# Patient Record
Sex: Male | Born: 1940 | Race: Black or African American | Hispanic: No | Marital: Single | Smoking: Former smoker
Health system: Southern US, Community
[De-identification: ages and names within clinical notes are randomized; demographics above are authoritative.]

---

## 2015-07-22 ENCOUNTER — Encounter (HOSPITAL_COMMUNITY): Payer: Self-pay | Admitting: Cardiology

## 2015-07-22 ENCOUNTER — Emergency Department (HOSPITAL_COMMUNITY): Payer: Medicare Other

## 2015-07-22 ENCOUNTER — Inpatient Hospital Stay (HOSPITAL_COMMUNITY)
Admission: EM | Admit: 2015-07-22 | Discharge: 2015-07-25 | DRG: 180 | Disposition: A | Discharge status: Home / Self Care | Payer: Medicare Other | Attending: Internal Medicine | Admitting: Internal Medicine

## 2015-07-22 DIAGNOSIS — E43 Unspecified severe protein-calorie malnutrition: Secondary | ICD-10-CM | POA: Diagnosis not present

## 2015-07-22 DIAGNOSIS — Z7729 Contact with and (suspected ) exposure to other hazardous substances: Secondary | ICD-10-CM | POA: Diagnosis present

## 2015-07-22 DIAGNOSIS — C787 Secondary malignant neoplasm of liver and intrahepatic bile duct: Secondary | ICD-10-CM | POA: Diagnosis not present

## 2015-07-22 DIAGNOSIS — C7889 Secondary malignant neoplasm of other digestive organs: Secondary | ICD-10-CM | POA: Diagnosis present

## 2015-07-22 DIAGNOSIS — C7972 Secondary malignant neoplasm of left adrenal gland: Secondary | ICD-10-CM | POA: Diagnosis present

## 2015-07-22 DIAGNOSIS — C3491 Malignant neoplasm of unspecified part of right bronchus or lung: Secondary | ICD-10-CM | POA: Diagnosis not present

## 2015-07-22 DIAGNOSIS — C781 Secondary malignant neoplasm of mediastinum: Secondary | ICD-10-CM | POA: Diagnosis present

## 2015-07-22 DIAGNOSIS — R0602 Shortness of breath: Secondary | ICD-10-CM | POA: Diagnosis not present

## 2015-07-22 DIAGNOSIS — Z87891 Personal history of nicotine dependence: Secondary | ICD-10-CM | POA: Diagnosis not present

## 2015-07-22 DIAGNOSIS — G4733 Obstructive sleep apnea (adult) (pediatric): Secondary | ICD-10-CM | POA: Diagnosis present

## 2015-07-22 DIAGNOSIS — R918 Other nonspecific abnormal finding of lung field: Secondary | ICD-10-CM | POA: Insufficient documentation

## 2015-07-22 DIAGNOSIS — Z23 Encounter for immunization: Secondary | ICD-10-CM | POA: Diagnosis not present

## 2015-07-22 DIAGNOSIS — Z578 Occupational exposure to other risk factors: Secondary | ICD-10-CM

## 2015-07-22 DIAGNOSIS — Z681 Body mass index (BMI) 19 or less, adult: Secondary | ICD-10-CM | POA: Diagnosis not present

## 2015-07-22 DIAGNOSIS — J439 Emphysema, unspecified: Secondary | ICD-10-CM | POA: Diagnosis not present

## 2015-07-22 DIAGNOSIS — J449 Chronic obstructive pulmonary disease, unspecified: Secondary | ICD-10-CM

## 2015-07-22 DIAGNOSIS — C7951 Secondary malignant neoplasm of bone: Secondary | ICD-10-CM | POA: Diagnosis not present

## 2015-07-22 DIAGNOSIS — R079 Chest pain, unspecified: Secondary | ICD-10-CM | POA: Diagnosis present

## 2015-07-22 DIAGNOSIS — Z7982 Long term (current) use of aspirin: Secondary | ICD-10-CM

## 2015-07-22 DIAGNOSIS — C3490 Malignant neoplasm of unspecified part of unspecified bronchus or lung: Secondary | ICD-10-CM | POA: Diagnosis present

## 2015-07-22 DIAGNOSIS — Z720 Tobacco use: Secondary | ICD-10-CM | POA: Diagnosis not present

## 2015-07-22 DIAGNOSIS — K769 Liver disease, unspecified: Secondary | ICD-10-CM | POA: Insufficient documentation

## 2015-07-22 DIAGNOSIS — K8689 Other specified diseases of pancreas: Secondary | ICD-10-CM | POA: Insufficient documentation

## 2015-07-22 DIAGNOSIS — F17200 Nicotine dependence, unspecified, uncomplicated: Secondary | ICD-10-CM

## 2015-07-22 DIAGNOSIS — C799 Secondary malignant neoplasm of unspecified site: Secondary | ICD-10-CM

## 2015-07-22 LAB — CBC
HEMATOCRIT: 41.2 % (ref 39.0–52.0)
HEMOGLOBIN: 13.2 g/dL (ref 13.0–17.0)
MCH: 27.4 pg (ref 26.0–34.0)
MCHC: 32 g/dL (ref 30.0–36.0)
MCV: 85.5 fL (ref 78.0–100.0)
Platelets: 358 10*3/uL (ref 150–400)
RBC: 4.82 MIL/uL (ref 4.22–5.81)
RDW: 13.8 % (ref 11.5–15.5)
WBC: 6.8 10*3/uL (ref 4.0–10.5)

## 2015-07-22 LAB — HEPATIC FUNCTION PANEL
ALT: 15 U/L — AB (ref 17–63)
AST: 35 U/L (ref 15–41)
Albumin: 3.5 g/dL (ref 3.5–5.0)
Alkaline Phosphatase: 71 U/L (ref 38–126)
BILIRUBIN INDIRECT: 0.4 mg/dL (ref 0.3–0.9)
Bilirubin, Direct: 0.1 mg/dL (ref 0.1–0.5)
TOTAL PROTEIN: 7.1 g/dL (ref 6.5–8.1)
Total Bilirubin: 0.5 mg/dL (ref 0.3–1.2)

## 2015-07-22 LAB — I-STAT TROPONIN, ED: Troponin i, poc: 0.01 ng/mL (ref 0.00–0.08)

## 2015-07-22 LAB — BASIC METABOLIC PANEL
Anion gap: 10 (ref 5–15)
BUN: 12 mg/dL (ref 6–20)
CHLORIDE: 97 mmol/L — AB (ref 101–111)
CO2: 29 mmol/L (ref 22–32)
Calcium: 10 mg/dL (ref 8.9–10.3)
Creatinine, Ser: 0.9 mg/dL (ref 0.61–1.24)
GFR calc Af Amer: >60 mL/min (ref >60)
GLUCOSE: 108 mg/dL — AB (ref 65–99)
POTASSIUM: 4.8 mmol/L (ref 3.5–5.1)
Sodium: 136 mmol/L (ref 135–145)

## 2015-07-22 LAB — APTT: APTT: 27 s (ref 24–37)

## 2015-07-22 LAB — PROTIME-INR
INR: 1.01 (ref 0.00–1.49)
Prothrombin Time: 13.5 seconds (ref 11.6–15.2)

## 2015-07-22 MED ORDER — HYDROCODONE-ACETAMINOPHEN 5-325 MG PO TABS
1.0000 | ORAL_TABLET | ORAL | Status: DC | PRN
  Administered 2015-07-23 (×3): 1 via ORAL
  Administered 2015-07-24 – 2015-07-25 (×5): 2 via ORAL
  Filled 2015-07-22: qty 2
  Filled 2015-07-22: qty 1
  Filled 2015-07-22: qty 2
  Filled 2015-07-22: qty 1
  Filled 2015-07-22 (×3): qty 2
  Filled 2015-07-22: qty 1

## 2015-07-22 MED ORDER — ASPIRIN EC 325 MG PO TBEC: 325.0000 mg | DELAYED_RELEASE_TABLET | Freq: Four times a day (QID) | ORAL | Status: DC | PRN

## 2015-07-22 MED ORDER — IOHEXOL 300 MG/ML  SOLN
75.0000 mL | Freq: Once | INTRAMUSCULAR | Status: AC | PRN
  Administered 2015-07-22: 75 mL via INTRAVENOUS

## 2015-07-22 MED ORDER — BISACODYL 10 MG RE SUPP: 10.0000 mg | Freq: Every day | RECTAL | Status: DC | PRN

## 2015-07-22 MED ORDER — IPRATROPIUM BROMIDE 0.02 % IN SOLN
0.5000 mg | Freq: Once | RESPIRATORY_TRACT | Status: AC
  Administered 2015-07-22: 0.5 mg via RESPIRATORY_TRACT
  Filled 2015-07-22: qty 2.5

## 2015-07-22 MED ORDER — ONDANSETRON HCL 4 MG PO TABS
4.0000 mg | ORAL_TABLET | Freq: Four times a day (QID) | ORAL | Status: DC | PRN
Start: 2015-07-22 — End: 2015-07-25

## 2015-07-22 MED ORDER — MAGNESIUM HYDROXIDE 400 MG/5ML PO SUSP: 30.0000 mL | Freq: Every day | ORAL | Status: DC | PRN

## 2015-07-22 MED ORDER — ACETAMINOPHEN 650 MG RE SUPP: 650.0000 mg | Freq: Four times a day (QID) | RECTAL | Status: DC | PRN

## 2015-07-22 MED ORDER — ACETAMINOPHEN 325 MG PO TABS: 650.0000 mg | ORAL_TABLET | Freq: Four times a day (QID) | ORAL | Status: DC | PRN

## 2015-07-22 MED ORDER — FLEET ENEMA 7-19 GM/118ML RE ENEM
1.0000 | ENEMA | Freq: Once | RECTAL | Status: DC | PRN
  Filled 2015-07-22: qty 1

## 2015-07-22 MED ORDER — ENOXAPARIN SODIUM 30 MG/0.3ML ~~LOC~~ SOLN
30.0000 mg | SUBCUTANEOUS | Status: DC
  Administered 2015-07-23 – 2015-07-24 (×2): 30 mg via SUBCUTANEOUS
  Filled 2015-07-22 (×2): qty 0.3

## 2015-07-22 MED ORDER — ALBUTEROL SULFATE (2.5 MG/3ML) 0.083% IN NEBU
5.0000 mg | INHALATION_SOLUTION | Freq: Once | RESPIRATORY_TRACT | Status: AC
  Administered 2015-07-22: 5 mg via RESPIRATORY_TRACT
  Filled 2015-07-22: qty 6

## 2015-07-22 MED ORDER — ONDANSETRON HCL 4 MG/2ML IJ SOLN: 4.0000 mg | Freq: Four times a day (QID) | INTRAMUSCULAR | Status: DC | PRN

## 2015-07-22 MED ORDER — GUAIFENESIN 100 MG/5ML PO SYRP
200.0000 mg | ORAL_SOLUTION | Freq: Three times a day (TID) | ORAL | Status: DC | PRN
  Filled 2015-07-22: qty 10

## 2015-07-22 MED ORDER — SODIUM CHLORIDE 0.9 % IV SOLN
INTRAVENOUS | Status: AC
  Administered 2015-07-22: via INTRAVENOUS

## 2015-07-22 NOTE — ED Notes (Signed)
Reports chest pain, SOb and cough for the past month intermittently.

## 2015-07-22 NOTE — ED Notes (Signed)
Family at bedside. 

## 2015-07-22 NOTE — ED Notes (Signed)
Pt remains monitored by blood pressure and pulse ox.  

## 2015-07-22 NOTE — Progress Notes (Signed)
Called ER RN for report. Room ready.  

## 2015-07-22 NOTE — ED Notes (Signed)
Patient undressed, in gown, on monitor, continuous pulse oximetry and blood pressure cuff; 2 warm blankets given; visitor at bedside

## 2015-07-22 NOTE — H&P (Deleted)
[facility] History and Physical  Caleb Decock LEX:[id number] DOB: 1941/04/08 DOA: 07/22/2015  Referring physician: [facility]  PCP: No primary care provider on file.   Chief Complaint: cough/ SOB  HPI: Tyler Potter is a 74 y.o. male with no PMH, has not seen a doctor in years, came to ED with chest pain, SOB and cough for the past month or so, also weight loss.  Labs normal, no home meds, does not see doctors.  Daughter said she has been trying to "get him to come in" for some time now. CXR in ED showing R upper lobe collapse, suspected underlying lung mass. And +COPD. Heart normal.  Chest CT shows> a right perihilar mass which extends through the mediastinum into the sub carinal and aortopulmonary window regions. Postobstructive volume loss right upper lobe. Findings consistent with malignancy. Also evidence of pulmonary parenchymal metastasis, hepatic and adrenal metastasis and possible bone metastasis to the spine.  Patient's main complaint is chest pain and SOB with cough of whitish sputum in abundance. Nofevers.   Chart review: none  ROS  no fevers, no abd pain  intermittent constipation +  no diarrhea  no purulent sputum  no wheezing  no medical hx  no joint pain  Where does patient live ho[place] with family  Can patient participate in ADLs? For the most part  Past Medical History History reviewed. No pertinent past medical history. Past Surgical History History reviewed. No pertinent past surgical history. Family History History reviewed. No pertinent family history.  Social History  reports that he has quit smoking. He does not have any smokeless tobacco history on file. He reports that he does not drink alcohol or use illicit drugs. Allergies No Known Allergies Home medications Prior to Admission medications   Medication Sig Start Date End Date Taking? Authorizing Provider  aspirin 325 MG EC tablet Take 325 mg by mouth every 6 (six) hours as needed for  pain.   Yes Historical Provider, MD  guaifenesin (ROBITUSSIN) 100 MG/5ML syrup Take 200 mg by mouth 3 (three) times daily as needed for cough.   Yes Historical Provider, MD   Liver Function Tests No results for input(s): AST, ALT, ALKPHOS, BILITOT, PROT, ALBUMIN in the last 168 hours. No results for input(s): LIPASE, AMYLASE in the last 168 hours. CBC  Recent Labs Lab 07/22/15 1323  WBC 6.8  HGB 13.2  HCT 41.2  MCV 85.5  PLT 449   Basic Metabolic Panel  Recent Labs Lab 07/22/15 1323  NA 136  K 4.8  CL 97*  CO2 29  GLUCOSE 108*  BUN 12  CREATININE 0.90  CALCIUM 10.0     Filed Vitals:   07/22/15 1900 07/22/15 2054 07/22/15 2100 07/22/15 2130  BP: 137/79 138/88 125/78 135/83  Pulse: 94 75 78 72  Temp:      TempSrc:      Resp:  '18 17 16  '$ SpO2: 100% 97% 100% 97%   Exam: Small-framed AAM in no distress. Wt loss evident.  No rash, cyanosis or gangrene Sclera anicteric, throat clear No jvd or nodes in neck Chest decreased BS throughout, no wheezing, occ rhonchi RRR no MRG Abd is soft ntnd no mass or ascites GU normal male Ext no LE edema, no wounds/ amputations/ ulcers Neuro is alert, O x3 , pleasant  CXR (independently reviewed) > R upper lobe collapse CT chest > see above in HPI Home medications > none Labs reviewed - wnl  Assessment: 1. 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 Chest pain/ SOB/ cough - no PNA or fever, prob due to malignancy 3. COPD  4. Smoker  Plan - admit, get ONC consult in am   DVT Prophylaxis lovenox  Code Status: full  Family Communication: daughter at bedside  Disposition Plan: home when stable    Sol Blazing [facility] Pager [phone]  Cell [phone]  If 7PM-7AM, please contact night-coverage [email or web address] Password [email or web address] 07/22/2015, 9:59 PM

## 2015-07-22 NOTE — ED Provider Notes (Signed)
CSN: [id number]     Arrival date & time 07/22/15  1256 History   First MD Initiated Contact with Patient 07/22/15 1644     Chief Complaint  Patient presents with  . Chest Pain  . Cough     (Consider location/radiation/quality/duration/timing/severity/associated sxs/prior Treatment) Patient is a 74 y.o. male presenting with chest pain and cough. The history is provided by the patient and medical records.  Chest Pain Associated symptoms: cough   Cough Associated symptoms: chest pain     74 year old male with no documented medical history, presenting to the ED for intermittent chest pain and productive cough over the past month.  Patient states he feels that there is a lot of congestion in his chest. He states he will have a productive cough for a few days and began to feel better but symptoms generally recur. He states he is coughing so much he is having "soreness" in his chest. He states he does have some episodes of shortness of breath with heavy bouts of coughing. He denies any fever or chills. No sweats. Patient has no known cardiac history. Mother did have heart disease. Patient is a former smoker, quit approximately 1 year ago. He did smoke for approximately 60 years.  Patient's daughter brought him here from [facility] for evaluation. He has not seen a doctor in several years. She admits that patient appears thinner than normal, appears to have lost approximately 20 pounds or so.  Patient states he eats/drinks regularly, just not as much as normal due to decreased appetite.  VSS.  History reviewed. No pertinent past medical history. History reviewed. No pertinent past surgical history. History reviewed. No pertinent family history. Social History  Substance Use Topics  . Smoking status: Former Research scientist (life sciences)  . Smokeless tobacco: None  . Alcohol Use: No    Review of Systems  Constitutional: Positive for appetite change and unexpected weight change.  Respiratory: Positive for cough.    Cardiovascular: Positive for chest pain.  All other systems reviewed and are negative.     Allergies  Review of patient's allergies indicates no known allergies.  Home Medications   Prior to Admission medications   Medication Sig Start Date End Date Taking? Authorizing Provider  aspirin 325 MG EC tablet Take 325 mg by mouth every 6 (six) hours as needed for pain.   Yes Historical Provider, MD  guaifenesin (ROBITUSSIN) 100 MG/5ML syrup Take 200 mg by mouth 3 (three) times daily as needed for cough.   Yes Historical Provider, MD   BP 145/79 mmHg  Pulse 71  Temp(Src) 98.2 F (36.8 C) (Oral)  Resp 18  SpO2 99%   Physical Exam  Constitutional: He is oriented to person, place, and time. He appears well-developed and well-nourished.  Very thin  HENT:  Head: Normocephalic and atraumatic.  Mouth/Throat: Oropharynx is clear and moist.  Eyes: Conjunctivae and EOM are normal. Pupils are equal, round, and reactive to light.  Neck: Normal range of motion.  Cardiovascular: Normal rate, regular rhythm and normal heart sounds.   Pulmonary/Chest: Effort normal. No respiratory distress. He has no wheezes. He has rhonchi.  Coarse breath sounds bilaterally with intermixed rhonchi, no distress, speaking in full sentences without difficulty  Abdominal: Soft. Bowel sounds are normal.  Musculoskeletal: Normal range of motion.  Neurological: He is alert and oriented to person, place, and time.  Skin: Skin is warm and dry.  Psychiatric: He has a normal mood and affect.  Nursing note and vitals reviewed.   ED Course  Procedures (including critical care time) Labs Review Labs Reviewed  BASIC METABOLIC PANEL - Abnormal; Notable for the following:    Chloride 97 (*)    Glucose, Bld 108 (*)    All other components within normal limits  HEPATIC FUNCTION PANEL - Abnormal; Notable for the following:    ALT 15 (*)    All other components within normal limits  CBC  PROTIME-INR  APTT  I-STAT  TROPOININ, ED    Imaging Review Dg Chest 2 View  07/22/2015  CLINICAL DATA:  Productive cough for the past 2 weeks. Mid chest pain. Ex-smoker. EXAM: CHEST  2 VIEW COMPARISON:  None. FINDINGS: Right upper lobe collapse. Bilateral nipple shadows. Mild hyperexpansion of the lungs with mild central peribronchial thickening. Normal sized heart. Thoracolumbar spine degenerative changes. IMPRESSION: 1. Right upper lobe collapse. This is concerning for a centrally obstructing mass or mucus plug. A chest CT with contrast may provide useful additional information. 2. Mild changes of COPD and chronic bronchitis. Electronically Signed   By: Claudie Revering M.D.   On: 07/22/2015 13:54   Ct Chest W Contrast  07/22/2015  CLINICAL DATA:  Productive cough for 1 month, shortness of breath, abnormal chest radiograph, smoking history EXAM: CT CHEST WITH CONTRAST TECHNIQUE: Multidetector CT imaging of the chest was performed during intravenous contrast administration. CONTRAST:  75m OMNIPAQUE IOHEXOL 300 MG/ML  SOLN COMPARISON:  Chest radiograph 07/22/2015 showing right upper lobe opacity FINDINGS: There is mild centrilobular emphysematous change. There is no evidence of endobronchial mass or mucous plug. The right upper lobe bronchus appear extrinsically compressed by a large soft tissue mass. There is a sub carinal heterogeneous enhancing mass which extends into the right hilum and perihilar right upper lobe. There is associated collapse of the right upper lobe in the right suprahilar region, mass measures about 37 x 50 mm. In the sub carinal region, the mass measures about 23 x 28 mm. Mass and into the anterior mediastinum and into the aortopulmonary window, worse soft tissue mass component measures 11 x 22 mm. There is a microlobulated mass image number 42 in the right lower lobe, measuring 15 x 9 mm. There is a subpleural nodule image number 51 right lower lobe measuring 4 mm. There is an area of irregular ground-glass  attenuation laterally in the right lower lobe measuring about 1 cm, and in the inferior right middle lobe on image number 37 there is a 5 mm nodule. There is mild volume loss in the inferior lingula. There is a sub solid pulmonary nodule laterally in the left lung base on image number 56 measuring 7 mm. Thyroid is normal. Thoracic aorta is normal. No abnormalities involving pulmonary arteries. Minimal pericardial effusion. No pleural effusion. Images through the upper abdomen demonstrate innumerable liver masses with peripheral enhancement, the largest in the left lobe measuring 3 cm. There is a left adrenal gland mass measuring about 3 cm. There is mild fullness of the right adrenal gland without discrete mass. There is sclerosis of numerous lower cervical and upper thoracic vertebra possibly representing metastasis. IMPRESSION: 1. Right perihilar mass which extends through the mediastinum into the sub carinal and aortopulmonary window regions. Postobstructive volume loss right upper lobe. Findings consistent with malignancy. 2. Evidence of pulmonary parenchymal metastasis 3. Hepatic and adrenal metastasis 4. Possible bone metastasis to the spine. Electronically Signed   By: RSkipper ClicheM.D.   On: 07/22/2015 19:27   I have personally reviewed and evaluated these images and lab results as part  of my medical decision-making.   EKG Interpretation None      MDM   Final diagnoses:  Metastatic lung cancer (metastasis from lung to other site), unspecified laterality [facility])   74 year old male here with intermittent chest pain, cough, and weight loss over the past month. Strong smoking history.  Patient appears very thin.  No fever, chills, sweats.  Lungs with coarse breath sounds bilaterally.  Concern for possible malignancy.  Labs overall reassuring.  CXR with questionable mass vs mucus plug.  CT chest was obtained revealing right perihilar mass extending into mediastinum with likely mets of liver,  adrenals, and possibly bone as well.  Patient is without any ongoing medical care, appears malnourished, and will need further work-up with oncology consultation.  Patient will be admitted to medicine service for further management.  Results discussed at length with patient and daughter, they acknowledged understanding.  Larene Pickett, PA-C 07/22/15 2245  Leonard Schwartz, MD 07/25/15 [phone]

## 2015-07-23 ENCOUNTER — Observation Stay (HOSPITAL_COMMUNITY): Payer: Medicare Other

## 2015-07-23 DIAGNOSIS — J439 Emphysema, unspecified: Secondary | ICD-10-CM | POA: Diagnosis not present

## 2015-07-23 DIAGNOSIS — E46 Unspecified protein-calorie malnutrition: Secondary | ICD-10-CM

## 2015-07-23 DIAGNOSIS — K769 Liver disease, unspecified: Secondary | ICD-10-CM | POA: Insufficient documentation

## 2015-07-23 DIAGNOSIS — R918 Other nonspecific abnormal finding of lung field: Secondary | ICD-10-CM | POA: Diagnosis not present

## 2015-07-23 DIAGNOSIS — C3491 Malignant neoplasm of unspecified part of right bronchus or lung: Secondary | ICD-10-CM | POA: Diagnosis not present

## 2015-07-23 DIAGNOSIS — R0602 Shortness of breath: Secondary | ICD-10-CM

## 2015-07-23 DIAGNOSIS — K8689 Other specified diseases of pancreas: Secondary | ICD-10-CM | POA: Insufficient documentation

## 2015-07-23 DIAGNOSIS — R938 Abnormal findings on diagnostic imaging of other specified body structures: Secondary | ICD-10-CM | POA: Diagnosis not present

## 2015-07-23 DIAGNOSIS — Z720 Tobacco use: Secondary | ICD-10-CM

## 2015-07-23 DIAGNOSIS — R079 Chest pain, unspecified: Secondary | ICD-10-CM | POA: Diagnosis not present

## 2015-07-23 MED ORDER — INFLUENZA VAC SPLIT QUAD 0.5 ML IM SUSY
0.5000 mL | PREFILLED_SYRINGE | INTRAMUSCULAR | Status: AC
  Administered 2015-07-24: 0.5 mL via INTRAMUSCULAR
  Filled 2015-07-23: qty 0.5

## 2015-07-23 MED ORDER — IOHEXOL 300 MG/ML  SOLN
80.0000 mL | Freq: Once | INTRAMUSCULAR | Status: AC | PRN
  Administered 2015-07-23: 100 mL via INTRAVENOUS

## 2015-07-23 MED ORDER — PNEUMOCOCCAL VAC POLYVALENT 25 MCG/0.5ML IJ INJ
0.5000 mL | INJECTION | INTRAMUSCULAR | Status: AC
  Administered 2015-07-24: 0.5 mL via INTRAMUSCULAR
  Filled 2015-07-23: qty 0.5

## 2015-07-23 NOTE — H&P (Deleted)
Expand All Collapse All   [facility] History and Physical  Hymie Gorr QBH:[id number] DOB: April 11, 1941 DOA: 07/22/2015  Referring physician: [facility]  PCP: No primary care provider on file.   Chief Complaint: cough/ SOB  HPI: Tyler Potter is a 74 y.o. male with no PMH, has not seen a doctor in years, came to ED with chest pain, SOB and cough for the past month or so, also weight loss. Labs normal, no home meds, does not see doctors. Daughter said she has been trying to "get him to come in" for some time now. CXR in ED showing R upper lobe collapse, suspected underlying lung mass. And +COPD. Heart normal.  Chest CT shows> a right perihilar mass which extends through the mediastinum into the sub carinal and aortopulmonary window regions. Postobstructive volume loss right upper lobe. Findings consistent with malignancy. Also evidence of pulmonary parenchymal metastasis, hepatic and adrenal metastasis and possible bone metastasis to the spine.  Patient's main complaint is chest pain and SOB with cough of whitish sputum in abundance. Nofevers.   Chart review: none  ROS no fevers, no abd pain intermittent constipation + no diarrhea no purulent sputum no wheezing no medical hx no joint pain  Where does patient live [place] with family  Can patient participate in ADLs? For the most part  Past Medical History History reviewed. No pertinent past medical history. Past Surgical History History reviewed. No pertinent past surgical history. Family History History reviewed. No pertinent family history.  Social History  reports that he has quit smoking. He does not have any smokeless tobacco history on file. He reports that he does not drink alcohol or use illicit drugs. Allergies No Known Allergies Home medications Prior to Admission medications   Medication Sig Start Date End Date Taking? Authorizing Provider  aspirin 325 MG EC tablet  Take 325 mg by mouth every 6 (six) hours as needed for pain.   Yes Historical Provider, MD  guaifenesin (ROBITUSSIN) 100 MG/5ML syrup Take 200 mg by mouth 3 (three) times daily as needed for cough.   Yes Historical Provider, MD   Liver Function Tests  Last Labs     No results for input(s): AST, ALT, ALKPHOS, BILITOT, PROT, ALBUMIN in the last 168 hours.    Last Labs     No results for input(s): LIPASE, AMYLASE in the last 168 hours.   CBC  Last Labs      Recent Labs Lab 07/22/15 1323  WBC 6.8  HGB 13.2  HCT 41.2  MCV 85.5  PLT 358     Basic Metabolic Panel  Last Labs      Recent Labs Lab 07/22/15 1323  NA 136  K 4.8  CL 97*  CO2 29  GLUCOSE 108*  BUN 12  CREATININE 0.90  CALCIUM 10.0       Filed Vitals:   07/22/15 1900 07/22/15 2054 07/22/15 2100 07/22/15 2130  BP: 137/79 138/88 125/78 135/83  Pulse: 94 75 78 72  Temp:      TempSrc:      Resp:  '18 17 16  '$ SpO2: 100% 97% 100% 97%   Exam: Small-framed AAM in no distress. Wt loss evident.  No rash, cyanosis or gangrene Sclera anicteric, throat clear No jvd or nodes in neck Chest decreased BS throughout, no wheezing, occ rhonchi RRR no MRG Abd is soft ntnd no mass or ascites GU normal male Ext no LE edema,  no wounds/ amputations/ ulcers Neuro is alert, O x3 , pleasant  CXR (independently reviewed) > R upper lobe collapse CT chest > see above in HPI Home medications > none Labs reviewed - wnl  Assessment: 1. 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 Chest pain/ SOB/ cough - no PNA or fever, prob due to malignancy 3. COPD  4. Smoker  Plan - admit, get ONC consult in am   DVT Prophylaxis lovenox  Code Status: full  Family Communication: daughter at bedside  Disposition Plan: home when stable    Sol Blazing [facility] Pager [phone] Cell [phone]  If 7PM-7AM, please contact  night-coverage [email or web address] Password [email or web address] 07/22/2015, 9:59 PM         Deleted by: Roney Jaffe, MD at 07/23/2015 1:05 AM  Chart Correction History     Date/Time User Action     07/23/2015 1:05 AM Roney Jaffe, MD Delete Data     Routing History     Date/Time From To Method   07/22/2015 10:44 PM Roney Jaffe, MD Roney Jaffe, MD Fax

## 2015-07-23 NOTE — Consult Note (Signed)
Tyler Potter  Telephone:([phone]) [place] NOTE  Tyler Potter                                MR#: [id number]  DOB: [birth year]                       CSN#: [id number]  Referring MD: Dr. Shel[facility]  Primary Provider: Mariah Milling, MD  Reason for Consult: Lung Cancer   LPF:[id number] Tyler Potter is a 74 y.o. male not seen by a physician in many years, admitted on 10/24 with increasing, 1 month history shortness of breath and chest pain. Denies fevers, chills, night sweats or mucositis. He reports frothy sputum production without hemoptysis. Denies any   palpitations. Denies lower extremity swelling. Denies nausea, heartburn or change in bowel habits. Appetite is normal, but has had unintentional 15 lbs weight loss over the last 6 months. Denies any dysuria. Denies abnormal skin rashes, or neuropathy. Denies any bleeding issues such as epistaxis, hematemesis, hematuria or hematochezia. Denies headaches, seizures, vision changes, bladder or bowel incontinence. No confusion is reported. Ambulating without difficulty. At the ED a chest x ray revealed right upper lobe collapse prompting further workup. Chest CT on 10/24 showed a right perihilar mass extending through the mediastinum into the subcarinal and aortopulmonary window regions. Postobstructive volume loss right upper lobe was seen. Also, evidence of pulmonary parenchymal metastasis, hepatic and adrenal metastasis and possible bone metastasis to the spine were noted. CT of the abdomen and pelvis are pending. IR to perform biopsy after evaluating all films for best sample. Risk factors include prior tobacco abuse, COPD, OSA, exposure to hazardous chemicals at work prior to retirement. No family history of cancers We were requested to see this patient with recommendations.        PMH:  History reviewed. No pertinent past medical history.  Surgeries:  History reviewed. No pertinent past  surgical history.  Allergies: No Known Allergies  Medications:   Scheduled Meds: . enoxaparin (LOVENOX) injection  30 mg Subcutaneous Q24H  . [START ON 07/24/2015] Influenza vac split quadrivalent PF  0.5 mL Intramuscular Tomorrow-1000  . [START ON 07/24/2015] pneumococcal 23 valent vaccine  0.5 mL Intramuscular Tomorrow-1000   Continuous Infusions:   PRN Meds:.bisacodyl, guaifenesin, HYDROcodone-acetaminophen, magnesium hydroxide, sodium phosphate  DZH:[id number] **OR** acetaminophen, bisacodyl, guaifenesin, HYDROcodone-acetaminophen, magnesium hydroxide, ondansetron **OR** ondansetron (ZOFRAN) IV, sodium phosphate  ROS: Constitutional: Denies fevers, chills or abnormal night sweats Eyes: Denies blurriness of vision, double vision or watery eyes Ears, nose, mouth, throat, and face: Denies mucositis or sore throat Respiratory: Denies cough, dyspnea or wheezes Cardiovascular: Denies palpitation, chest discomfort or lower extremity swelling Gastrointestinal:  Denies nausea, heartburn or change in bowel habits Skin: Denies abnormal skin rashes Lymphatics: Denies new lymphadenopathy or easy bruising Neurological:Denies numbness, tingling or new weaknesses Behavioral/Psych: Mood is stable, no new changes  All other systems were reviewed with the patient and are negative.   Family History:    History reviewed. No pertinent family history.   No family history of hematological  disorders.  Social History:  reports that he has quit smoking 2 yrs ago, more than 1 ppd for 50 + yrs. He does not have any smokeless tobacco history on file. He reports that he does not drink alcohol or use illicit drugs.  Physical Exam   ECOG PERFORMANCE STATUS:1  Filed Vitals:  07/23/15 1100  BP: 111/67  Pulse: 66  Temp: 98 F (36.7 C)  Resp: 18   Filed Weights   07/22/15 2258  Weight: 104 lb 14.4 oz (47.582 kg)    GENERAL:alert, no distress and comfortable, chronically ill appearing,  frail, temporal wasting noted SKIN: skin color, texture, turgor are normal, no rashes or significant lesions EYES: normal, conjunctiva are pink and non-injected, sclera clear OROPHARYNX:no exudate, no erythema and lips, buccal mucosa, and tongue with mild white coating. NECK: supple, thyroid normal size, non-tender, without nodularity LYMPH:  no palpable lymphadenopathy in the cervical, axillary or inguinal LUNGS:decreased breath sounds diffuselywith normal breathing effort HEART: regular rate & rhythm and no murmurs and no lower extremity edema ABDOMEN:abdomen soft, non-tender and normal bowel sounds Musculoskeletal:no cyanosis of digits and no clubbing  PSYCH: alert & oriented x 3 with fluent speech NEURO: no focal motor/sensory deficits  CBC  Recent Labs Lab 07/22/15 1323  WBC 6.8  HGB 13.2  HCT 41.2  PLT 358  MCV 85.5  MCH 27.4  MCHC 32.0  RDW 13.8    Anemia panel:  No results for input(s): VITAMINB12, FOLATE, FERRITIN, TIBC, IRON, RETICCTPCT in the last 72 hours.  CMP    Recent Labs Lab 07/22/15 1323 07/22/15 2200  NA 136  --   K 4.8  --   CL 97*  --   CO2 29  --   GLUCOSE 108*  --   BUN 12  --   CREATININE 0.90  --   CALCIUM 10.0  --   AST  --  35  ALT  --  15*  ALKPHOS  --  71  BILITOT  --  0.5        Component Value Date/Time   BILITOT 0.5 07/22/2015 2200   BILIDIR 0.1 07/22/2015 2200   IBILI 0.4 07/22/2015 2200      Recent Labs Lab 07/22/15 2200  INR 1.01    No results for input(s): DDIMER in the last 72 hours.  Imaging Studies:  Dg Chest 2 View  07/22/2015  CLINICAL DATA:  Productive cough for the past 2 weeks. Mid chest pain. Ex-smoker. EXAM: CHEST  2 VIEW COMPARISON:  None. FINDINGS: Right upper lobe collapse. Bilateral nipple shadows. Mild hyperexpansion of the lungs with mild central peribronchial thickening. Normal sized heart. Thoracolumbar spine degenerative changes. IMPRESSION: 1. Right upper lobe collapse. This is  concerning for a centrally obstructing mass or mucus plug. A chest CT with contrast may provide useful additional information. 2. Mild changes of COPD and chronic bronchitis. Electronically Signed   By: Claudie Revering M.D.   On: 07/22/2015 13:54   Ct Chest W Contrast  07/22/2015  CLINICAL DATA:  Productive cough for 1 month, shortness of breath, abnormal chest radiograph, smoking history EXAM: CT CHEST WITH CONTRAST TECHNIQUE: Multidetector CT imaging of the chest was performed during intravenous contrast administration. CONTRAST:  36m OMNIPAQUE IOHEXOL 300 MG/ML  SOLN COMPARISON:  Chest radiograph 07/22/2015 showing right upper lobe opacity FINDINGS: There is mild centrilobular emphysematous change. There is no evidence of endobronchial mass or mucous plug. The right upper lobe bronchus appear extrinsically compressed by a large soft tissue mass. There is a sub carinal heterogeneous enhancing mass which extends into the right hilum and perihilar right upper lobe. There is associated collapse of the right upper lobe in the right suprahilar region, mass measures about 37 x 50 mm. In the sub carinal region, the mass measures about 23 x 28 mm. Mass and into  the anterior mediastinum and into the aortopulmonary window, worse soft tissue mass component measures 11 x 22 mm. There is a microlobulated mass image number 42 in the right lower lobe, measuring 15 x 9 mm. There is a subpleural nodule image number 51 right lower lobe measuring 4 mm. There is an area of irregular ground-glass attenuation laterally in the right lower lobe measuring about 1 cm, and in the inferior right middle lobe on image number 37 there is a 5 mm nodule. There is mild volume loss in the inferior lingula. There is a sub solid pulmonary nodule laterally in the left lung base on image number 56 measuring 7 mm. Thyroid is normal. Thoracic aorta is normal. No abnormalities involving pulmonary arteries. Minimal pericardial effusion. No pleural  effusion. Images through the upper abdomen demonstrate innumerable liver masses with peripheral enhancement, the largest in the left lobe measuring 3 cm. There is a left adrenal gland mass measuring about 3 cm. There is mild fullness of the right adrenal gland without discrete mass. There is sclerosis of numerous lower cervical and upper thoracic vertebra possibly representing metastasis. IMPRESSION: 1. Right perihilar mass which extends through the mediastinum into the sub carinal and aortopulmonary window regions. Postobstructive volume loss right upper lobe. Findings consistent with malignancy. 2. Evidence of pulmonary parenchymal metastasis 3. Hepatic and adrenal metastasis 4. Possible bone metastasis to the spine. Electronically Signed   By: Skipper Cliche M.D.   On: 07/22/2015 19:27     Assessment/Plan: 74 y.o.   Metastatic Right Perihilar Mass This mass extends through the mediastinum into the sub carinal and aortopulmonary window regions, parenchyma, liver, adrenal as well as bone metastasis to the spine. CT abdomen and pelvis pending IR to perform biopsy of a mass, but awaiting completion of CTs before proceeding Once diagnosis is obtained, will proceed with recommendations, including treatment options if a candidate. Tobacco cessation recommended    Malnutrition Consider Nutrition evaluation   DVT prophylaxis On Lovenox  Full Code   Other medical issues as per admitting team     [place], PA-C 07/23/2015 12:48 PM   Patient seen and examined personally please see note above. This is a pleasant gentleman with heavy tobacco use and does not get routine medical care presented with acute shortness of breath and the CT findings discussed above. I personally reviewed his CT scan which showed a large hilar mass suspicious for a lung neoplasm with liver and lung metastasis. Clinically, he feels fairly comfortable at rest with dyspnea on exertion. He does not report any cough or  hemoptysis. He has reported some weight loss and deterioration in his performance status. He was brought in by his daughter who lives in town to get medical care moving forward.  On physical examination, cachectic ill-appearing gentleman without distress. Heart was regular rate and rhythm without murmurs. Lungs fields bilateral scattered wheezes. No neck adenopathy noted. Abdomen was soft nontender extremities without edema.  Laboratory data were reviewed and appear predominantly within normal range.  Impression and recommendations:   74 year old gentleman with a right hilar mass suspicious for lung malignancy. He has also adrenal and liver lesions likely suspicious for metastasis.  The differential diagnosis was discussed with the patient and his daughter today. Small cell lung cancer is certainly possibility given the central location of his tumor although non-small cell lung cancer is also a possibility among other histologies.  A management standpoint, I agree with CT scan of the abdomen and pelvis and I will defer obtaining  an MRI if there is any any neurological symptoms.  Once the diagnosis is obtained, we'll arrange follow-up in the outpatient setting to discuss management options at that time. This will range from chemotherapy, radiation and possibly both. Surgery is not an option at this time.  He can be discharged after biopsy if he is clinically stable.

## 2015-07-23 NOTE — H&P (Deleted)
Expand All Collapse All   [facility] History and Physical  Tyler Potter WFU:[id number] DOB: 10/22/1970 DOA: 07/22/2015  Referring physician: Dr Kathrynn Humble PCP: Mariah Milling, MD   Chief Complaint: Syncopal episode  HPI: Tyler Potter is a 74 y.o. male with hx of HTN, CVA, OSA, morbic obesity, CKD stage 3/4, afib and systolic CHF who presents with episode of syncope. She had eaten lunch today and was standing w her husband and the next thing she new she was on on the floor. Her husband told her she passed out. No seizure-like activity. No associated palpitations, CP, SOB or dizziness preceding or after the episode. No recent illness, n/v/d or abd pain. No recent medication changes. Takes lasix for CKD/ edema and edema is about usual. No orthopnea/ PND or DOE. Uses CPAP mask at night and just saw her pulm MD this morning Dr Annamaria Boots.   Home meds > norvasc, lipitor, clonidine patch, Aggrenox, ezetimibe, furosemide 80/d, gabapentin, glipizide, insulin detemir, losartan, naproxen, miralax, ranitidine, saxagliptin, vit D  Last and only hosp stay here was Apr 2015 with dysarthria due to acute left pontine CVA, uncont DM2, OSA, HTN, and anemia. Also a/chronic renal failure. Started on Aggrenox. Carotid dopplers neg for sig stenosis. Echo showed EF 40-45%  ROS no HA, no blurred vision no focal numbness or weakness no CP no SOB no abd pain  Where does patient live home w husband Can patient participate in ADLs? yes  Past Medical History  Past Medical History  Diagnosis Date  . CVA (cerebral infarction)   . Leg swelling   . CPAP (continuous positive airway pressure) dependence   . Sleep apnea   . Hypertension   . Hypercholesteremia   . Obese   . Thyroid function test abnormal     currently no treatment   . Chronic renal insufficiency     Followed by [facility]/nephrology- stage III- followed by [facility]  . GERD (gastroesophageal  reflux disease)   . Left sided numbness     weakness, still able to move her leg & arm  . Dysrhythmia     holter monitor showed irreg. heartbeat but she reports that everything was ok  . Asthma     as a child, currently doesn't use an inhaler   . Stroke [facility]) 2010    Left side   . Anemia   . Atrial fibrillation ([place])   . CHF (congestive heart failure) ([place])   . Family history of adverse reaction to anesthesia   . Complication of anesthesia   . Diabetes mellitus without complication [facility])    Past Surgical History  Past Surgical History  Procedure Laterality Date  . No past surgeries    . Av fistula placement Left 07/01/2015    Procedure: CREATION OF LEFT UPPER ARM ARTERIOVENOUS (AV) FISTULA ; Surgeon: Conrad Riverside, MD; Location: [facility] OR; Service: Vascular; Laterality: Left;   Family History  Family History  Problem Relation Age of Onset  . Hypertension Other   . Diabetes Other   . Heart attack Father   . Peripheral vascular disease Father     Left Great  . Hypertension Father   . Stroke Father   . Asthma Mother   . Hyperlipidemia Mother   . Hypertension Mother     Social History  reports that she has never smoked. She has never used smokeless tobacco. She reports that she does not drink alcohol or use illicit drugs. Allergies  Allergies  Allergen Reactions  . Hydralazine  Itching, Swelling and Other (See Comments)    Welps  . Lisinopril Other (See Comments)    Bad for kidneys   Home medications Prior to Admission medications   Medication Sig Start Date End Date Taking? Authorizing Provider  amLODipine (NORVASC) 10 MG tablet Take 10 mg by mouth daily with breakfast.    Yes Historical Provider, MD  atorvastatin (LIPITOR) 80 MG tablet Take 80 mg by mouth every evening.   Yes Historical Provider, MD  Brinzolamide-Brimonidine  [facility]) 1-0.2 % SUSP Place 1 drop into both eyes 2 (two) times daily.   Yes Historical Provider, MD  cloNIDine (CATAPRES - DOSED IN MG/24 HR) 0.3 mg/24hr patch Place 0.3 mg onto the skin once a week. On Sunday   Yes Historical Provider, MD  clotrimazole (LOTRIMIN) 1 % cream Apply 1 application topically as needed (for rash).  03/11/15  Yes Historical Provider, MD  dipyridamole-aspirin (AGGRENOX) 200-25 MG per 12 hr capsule Take 1 capsule by mouth 2 (two) times daily. Start on 01/24/14 01/24/14  Yes Ripudeep Krystal Eaton, MD  ezetimibe (ZETIA) 10 MG tablet Take 10 mg by mouth daily.   Yes Historical Provider, MD  furosemide (LASIX) 80 MG tablet Take 80 mg by mouth daily.   Yes Historical Provider, MD  gabapentin (NEURONTIN) 300 MG capsule Take 300 mg by mouth at bedtime.  03/11/15  Yes Historical Provider, MD  glipiZIDE (GLUCOTROL XL) 10 MG 24 hr tablet Take 10 mg by mouth daily.  03/12/15  Yes Historical Provider, MD  Insulin Detemir (LEVEMIR) 100 UNIT/ML Pen Inject 8 Units into the skin at bedtime. Patient taking differently: Inject 30 Units into the skin at bedtime.  01/11/14  Yes Ripudeep Krystal Eaton, MD  losartan (COZAAR) 50 MG tablet Take 50 mg by mouth daily.  12/26/14  Yes Historical Provider, MD  Multiple Vitamin (DAILY VITE PO) Take 1 tablet by mouth daily.   Yes Historical Provider, MD  polyethylene glycol (MIRALAX / GLYCOLAX) packet Take 17 g by mouth daily as needed for mild constipation.   Yes Historical Provider, MD  ranitidine (ZANTAC) 300 MG tablet Take 300 mg by mouth at bedtime.  03/11/15  Yes Historical Provider, MD  saxagliptin HCl (ONGLYZA) 5 MG TABS tablet Take 5 mg by mouth daily.   Yes Historical Provider, MD  Travoprost, BAK Free, (TRAVATAN) 0.004 % SOLN ophthalmic solution Place 1 drop into both eyes at bedtime.   Yes Historical Provider, MD  ergocalciferol (VITAMIN D2) 50000 UNITS capsule Take 50,000  Units by mouth 2 (two) times a week. On Tuesday and Thursday.    Historical Provider, MD  Insulin Pen Needle (INSUPEN PEN NEEDLES) 32G X 4 MM MISC Use with levemir flex pen 01/11/14   Ripudeep Krystal Eaton, MD  naproxen sodium (ANAPROX) 220 MG tablet Take 220 mg by mouth 2 (two) times daily as needed (for pain).    Historical Provider, MD   Liver Function Tests  Last Labs     No results for input(s): AST, ALT, ALKPHOS, BILITOT, PROT, ALBUMIN in the last 168 hours.    Last Labs     No results for input(s): LIPASE, AMYLASE in the last 168 hours.   CBC  Last Labs      Recent Labs Lab 07/22/15 1524  WBC 11.1*  HGB 10.0*  HCT 32.4*  MCV 96.4  PLT 302     Basic Metabolic Panel  Last Labs      Recent Labs Lab 07/22/15 1524  NA 138  K 5.6*  CL 111  CO2 21*  GLUCOSE 126*  BUN 52*  CREATININE 3.06*  CALCIUM 9.5      EKG: Independently reviewed >  Normal sinus rhythm Low voltage QRS Possible Inferior infarct , age undetermined Cannot rule out Anteroseptal infarct , age undetermined Abnormal ECG  CXR: independently reviewed > mild vasc congestion vs bronchitic changes, no infiltrates  Filed Vitals:   07/22/15 1745 07/22/15 1800 07/22/15 1815 07/22/15 1830  BP: 118/71 138/75 135/68 126/75  Pulse: 71 77 74 71  Temp:      TempSrc:      Resp:   13 13  SpO2: 100% 100% 100% 97%   Exam: Obese pleasant AAF no distress, lying flat in bed No rash, cyanosis or gangrene Sclera anicteric, throat clear and moist Flat neck veins, no bruits Chest is clear bilat to the bases, no rales or wheezing RRR no MRG, quiet precordium Abd obese, soft ntnd no ascites or masses GU deferred Ext 1+ pitting edema bilat LE's below the knees No wounds, ulcers LUE AVF w +bruit Neuro is alert, Ox 3 moves all ext. CN's grossly intact.   CXR > borderline CM, mild bronchitic changes CT head > chronic small vessel  ischemic changes, no acute infarct. Fluid in R max sinus. No acute process ECHO 4/15 > mod LVH, EF 40-45%, diffuse HK, grade 1 DD; technically difficult Mar '15 Renal US 12 cm bilat, no hydro, [facility] Pace Apr '15 UA 0-2 rbc / wbc , >300 prot  Assessment: 1. Syncope - first ever episode in patient w multiple comorbidities including hx morbid obesity/ OSA, HTN, DM2 on insulin, CVA pontine 4/15, syst CHF EF 45% compensated, CKD stage 4 recent AVF placement LUE. Euvolemic and orthostatics are normal. No rhythm changes on tele. Head CT negative. EKG abnormal in that there is very low voltage. Main possibilities include PE, arrhythmia, vs vasovagal. Needs ECHO r/o effusion but less likely. Plan admit , tele, d-dimer, and ECHO in am.  2. DM on insulin  3. HTN cont meds 4. CKD / LE edema cont lasix 80/d, stable, f/b [facility], recent AVF placement. Is not uremic 5. Hx CVA on aggrenox 6. Hyperkalemia K 5.6, due to CKD. Plan renal diet.  Plan - work up as above.   DVT Prophylaxis lovenox Code Status: full  Family Communication: none here  Disposition Plan: when stable and work up done    [employer] Pager [phone] Cell [phone]  If 7PM-7AM, please contact night-coverage [email or web address] Password [facility] 07/22/2015, 6:46 PM         Revision History     Date/Time User Provider Type Action   07/22/2015 9:41 PM Roney Jaffe, MD Physician Addend   07/22/2015 9:30 PM Roney Jaffe, MD Physician Incomplete Revision   07/22/2015 8:42 PM Roney Jaffe, MD Physician Sign   View Details Report       Routing History     Date/Time From To Method   07/22/2015 9:41 PM Roney Jaffe, MD Roney Jaffe, MD Fax

## 2015-07-23 NOTE — Progress Notes (Signed)
[facility]                                                                              Patient Demographics  Tyler Potter, is a 74 y.o. male, DOB - 12/28/1940, VVO:[id number]  Admit date - 07/22/2015   Admitting Physician Roney Jaffe, MD  Outpatient Primary MD for the patient is No primary care provider on file.  LOS - 1   Chief Complaint  Patient presents with  . Chest Pain  . Cough      HPI on 07/22/2015 by Dr. Roney Jaffe [name] is a 74 y.o. male with no PMH, has not seen a doctor in years, came to ED with chest pain, SOB and cough for the past month or so, also weight loss. Labs normal, no home meds, does not see doctors. Daughter said she has been trying to "get him to come in" for some time now. CXR in ED showing R upper lobe collapse, suspected underlying lung mass. And +COPD. Heart normal.  Chest CT shows> a right perihilar mass which extends through the mediastinum into the sub carinal and aortopulmonary window regions. Postobstructive volume loss right upper lobe. Findings consistent with malignancy. Also evidence of pulmonary parenchymal metastasis, hepatic and adrenal metastasis and possible bone metastasis to the spine.  Patient's main complaint is chest pain and SOB with cough of whitish sputum in abundance. Nofevers.   Assessment & Plan   Dyspnea secondary to lung mass -Noted on CT chest w/ contrast: Right. Another mass which extends through the mediastinum into the subcarinal and aortopulmonary windows, postobstructive volume loss in the right upper lung, hepatic and adrenal metastasis, pulmonary parenchymal metastasis, bone metastasis to the spine -CT abdomen and pelvis ordered -Oncology, Dr. Alen Blew, consulted and appreciated -IR consulted for possible biopsy -Patient has no white count, no leukocytosis unlikely shortness of breath related to pneumonia or infection  COPD -Currently compensated  Tobacco abuse -Patient states he  stopped smoking approximately one year ago.  Malnutrition -Likely secondary to the above, will consult nutrition  Code Status: Full  Family Communication: None at bedside  Disposition Plan: Admitted  Time Spent in minutes   30 minutes  Procedures  None  Consults   Oncology IR  DVT Prophylaxis  Lovenox  Lab Results  Component Value Date   PLT 358 07/22/2015    Medications  Scheduled Meds: . enoxaparin (LOVENOX) injection  30 mg Subcutaneous Q24H  . [START ON 07/24/2015] Influenza vac split quadrivalent PF  0.5 mL Intramuscular Tomorrow-1000  . [START ON 07/24/2015] pneumococcal 23 valent vaccine  0.5 mL Intramuscular Tomorrow-1000   Continuous Infusions:  PRN Meds:.acetaminophen **OR** acetaminophen, bisacodyl, guaifenesin, HYDROcodone-acetaminophen, magnesium hydroxide, ondansetron **OR** ondansetron (ZOFRAN) IV, sodium phosphate  Antibiotics   Anti-infectives    None      Subjective:   Camille Bal seen and examined today.  Patient states his shortness of breath has been ongoing for approximately 2-3 months. He has not seen a physician in some time. Patient denies any current chest pain. Denies any abdominal pain, nausea, vomiting, change in bowel pattern, headache or dizziness. Patient does admit to poor appetite.   Objective:   Filed Vitals:   07/22/15  2200 07/22/15 2258 07/23/15 0500 07/23/15 1100  BP: 142/84 128/85 138/81 111/67  Pulse: 72 72 70 66  Temp:  98.1 F (36.7 C) 98.4 F (36.9 C) 98 F (36.7 C)  TempSrc:  Oral Oral Oral  Resp: '17 18 18 18  '$ Height:  '5\' 9"'$  (1.753 m)    Weight:  47.582 kg (104 lb 14.4 oz)    SpO2: 98% 98% 94% 97%    Wt Readings from Last 3 Encounters:  07/22/15 47.582 kg (104 lb 14.4 oz)     Intake/Output Summary (Last 24 hours) at 07/23/15 1314 Last data filed at 07/23/15 1110  Gross per 24 hour  Intake 1037.92 ml  Output    125 ml  Net 912.92 ml    Exam  General: Well developed, well-nourished, thin,  NAD  HEENT: NCAT,mucous membranes moist.   Cardiovascular: S1 S2 auscultated, no rubs, murmurs or gallops. Regular rate and rhythm.  Respiratory: Diminished breath sounds, no wheezing   Abdomen: Soft, nontender, nondistended, + bowel sounds  Extremities: warm dry without cyanosis clubbing or edema  Neuro: AAOx3, nonfocal  Psych: Normal affect and demeanor with intact judgement and insight  Data Review   Micro Results No results found for this or any previous visit (from the past 240 hour(s)).  Radiology Reports Dg Chest 2 View  07/22/2015  CLINICAL DATA:  Productive cough for the past 2 weeks. Mid chest pain. Ex-smoker. EXAM: CHEST  2 VIEW COMPARISON:  None. FINDINGS: Right upper lobe collapse. Bilateral nipple shadows. Mild hyperexpansion of the lungs with mild central peribronchial thickening. Normal sized heart. Thoracolumbar spine degenerative changes. IMPRESSION: 1. Right upper lobe collapse. This is concerning for a centrally obstructing mass or mucus plug. A chest CT with contrast may provide useful additional information. 2. Mild changes of COPD and chronic bronchitis. Electronically Signed   By: Claudie Revering M.D.   On: 07/22/2015 13:54   Ct Chest W Contrast  07/22/2015  CLINICAL DATA:  Productive cough for 1 month, shortness of breath, abnormal chest radiograph, smoking history EXAM: CT CHEST WITH CONTRAST TECHNIQUE: Multidetector CT imaging of the chest was performed during intravenous contrast administration. CONTRAST:  37m OMNIPAQUE IOHEXOL 300 MG/ML  SOLN COMPARISON:  Chest radiograph 07/22/2015 showing right upper lobe opacity FINDINGS: There is mild centrilobular emphysematous change. There is no evidence of endobronchial mass or mucous plug. The right upper lobe bronchus appear extrinsically compressed by a large soft tissue mass. There is a sub carinal heterogeneous enhancing mass which extends into the right hilum and perihilar right upper lobe. There is associated  collapse of the right upper lobe in the right suprahilar region, mass measures about 37 x 50 mm. In the sub carinal region, the mass measures about 23 x 28 mm. Mass and into the anterior mediastinum and into the aortopulmonary window, worse soft tissue mass component measures 11 x 22 mm. There is a microlobulated mass image number 42 in the right lower lobe, measuring 15 x 9 mm. There is a subpleural nodule image number 51 right lower lobe measuring 4 mm. There is an area of irregular ground-glass attenuation laterally in the right lower lobe measuring about 1 cm, and in the inferior right middle lobe on image number 37 there is a 5 mm nodule. There is mild volume loss in the inferior lingula. There is a sub solid pulmonary nodule laterally in the left lung base on image number 56 measuring 7 mm. Thyroid is normal. Thoracic aorta is normal. No abnormalities involving  pulmonary arteries. Minimal pericardial effusion. No pleural effusion. Images through the upper abdomen demonstrate innumerable liver masses with peripheral enhancement, the largest in the left lobe measuring 3 cm. There is a left adrenal gland mass measuring about 3 cm. There is mild fullness of the right adrenal gland without discrete mass. There is sclerosis of numerous lower cervical and upper thoracic vertebra possibly representing metastasis. IMPRESSION: 1. Right perihilar mass which extends through the mediastinum into the sub carinal and aortopulmonary window regions. Postobstructive volume loss right upper lobe. Findings consistent with malignancy. 2. Evidence of pulmonary parenchymal metastasis 3. Hepatic and adrenal metastasis 4. Possible bone metastasis to the spine. Electronically Signed   By: Skipper Cliche M.D.   On: 07/22/2015 19:27    CBC  Recent Labs Lab 07/22/15 1323  WBC 6.8  HGB 13.2  HCT 41.2  PLT 358  MCV 85.5  MCH 27.4  MCHC 32.0  RDW 13.8    Chemistries   Recent Labs Lab 07/22/15 1323 07/22/15 2200  NA  136  --   K 4.8  --   CL 97*  --   CO2 29  --   GLUCOSE 108*  --   BUN 12  --   CREATININE 0.90  --   CALCIUM 10.0  --   AST  --  35  ALT  --  15*  ALKPHOS  --  71  BILITOT  --  0.5   ------------------------------------------------------------------------------------------------------------------ estimated creatinine clearance is 47 mL/min (by C-G formula based on Cr of 0.9). ------------------------------------------------------------------------------------------------------------------ No results for input(s): HGBA1C in the last 72 hours. ------------------------------------------------------------------------------------------------------------------ No results for input(s): CHOL, HDL, LDLCALC, TRIG, CHOLHDL, LDLDIRECT in the last 72 hours. ------------------------------------------------------------------------------------------------------------------ No results for input(s): TSH, T4TOTAL, T3FREE, THYROIDAB in the last 72 hours.  Invalid input(s): FREET3 ------------------------------------------------------------------------------------------------------------------ No results for input(s): VITAMINB12, FOLATE, FERRITIN, TIBC, IRON, RETICCTPCT in the last 72 hours.  Coagulation profile  Recent Labs Lab 07/22/15 2200  INR 1.01    No results for input(s): DDIMER in the last 72 hours.  Cardiac Enzymes No results for input(s): CKMB, TROPONINI, MYOGLOBIN in the last 168 hours.  Invalid input(s): CK ------------------------------------------------------------------------------------------------------------------ Invalid input(s): POCBNP    Hobart Marte D.O. on 07/23/2015 at 1:14 PM  Between 7am to 7pm - Pager - [phone]  After 7pm go to [email or web address] - password [email or web address]  And look for the night coverage person covering for me after hours  [facility] Office  [phone]

## 2015-07-23 NOTE — Consult Note (Signed)
Chief Complaint: Patient was seen in consultation today for US guided liver lesion biopsy Chief Complaint  Patient presents with  . Chest Pain  . Cough    Referring Physician(s): Shadad,F/TRH  History of Present Illness: Tyler Potter is a 74 y.o. male with past medical history significant for smoking, COPD and obstructive sleep apnea who was recently admitted on 10/24 with dyspnea, cough, chest pain and weight loss. Troponin was negative. Subsequent imaging revealed a right perihilar mass extending through the mediastinum into the subcarinal and aortopulmonary window regions. There was evidence of pulmonary parenchymal metastasis as well as multiple lesions within the liver and possible bone metastasis to the spine. In addition there was some porta hepatis lymphadenopathy and changes in the region of the head and uncinate process of the pancreas consistent with a focal mass lesion. Findings consistent with metastatic disease in the left adrenal gland were also noted. Patient has no known history of malignancy. He was evaluated by oncology and request has now been received for ultrasound-guided liver lesion biopsy.  History reviewed. No pertinent past medical history.  History reviewed. No pertinent past surgical history.  Allergies: Review of patient's allergies indicates no known allergies.  Medications: Prior to Admission medications   Medication Sig Start Date End Date Taking? Authorizing Provider  aspirin 325 MG EC tablet Take 325 mg by mouth every 6 (six) hours as needed for pain.   Yes Historical Provider, MD  guaifenesin (ROBITUSSIN) 100 MG/5ML syrup Take 200 mg by mouth 3 (three) times daily as needed for cough.   Yes Historical Provider, MD     History reviewed. No pertinent family history.  Social History   Social History  . Marital Status: Single    Spouse Name: N/A  . Number of Children: N/A  . Years of Education: N/A   Social History Main Topics  .  Smoking status: Former Research scientist (life sciences)  . Smokeless tobacco: None  . Alcohol Use: No  . Drug Use: No  . Sexual Activity: Not Asked   Other Topics Concern  . None   Social History Narrative  . None      Review of Systems see above; patient currently denies fever, headache, nausea vomiting or abnormal bleeding. He does have intermittent abdominal pain and occasional coughing.  Vital Signs: BP 111/67 mmHg  Pulse 66  Temp(Src) 98 F (36.7 C) (Oral)  Resp 18  Ht '5\' 9"'$  (1.753 m)  Wt 104 lb 14.4 oz (47.582 kg)  BMI 15.48 kg/m2  SpO2 97%  Physical Exam patient awake, alert. Chest with distant breath sounds bilaterally. Heart with regular rate and rhythm. Abdomen soft, positive bowel sounds, mild epigastric/ right upper quadrant tenderness to palpation; extremities- no edema.  Mallampati Score:     Imaging: Dg Chest 2 View  07/22/2015  CLINICAL DATA:  Productive cough for the past 2 weeks. Mid chest pain. Ex-smoker. EXAM: CHEST  2 VIEW COMPARISON:  None. FINDINGS: Right upper lobe collapse. Bilateral nipple shadows. Mild hyperexpansion of the lungs with mild central peribronchial thickening. Normal sized heart. Thoracolumbar spine degenerative changes. IMPRESSION: 1. Right upper lobe collapse. This is concerning for a centrally obstructing mass or mucus plug. A chest CT with contrast may provide useful additional information. 2. Mild changes of COPD and chronic bronchitis. Electronically Signed   By: Claudie Revering M.D.   On: 07/22/2015 13:54   Ct Chest W Contrast  07/22/2015  CLINICAL DATA:  Productive cough for 1 month, shortness of breath, abnormal chest  radiograph, smoking history EXAM: CT CHEST WITH CONTRAST TECHNIQUE: Multidetector CT imaging of the chest was performed during intravenous contrast administration. CONTRAST:  62m OMNIPAQUE IOHEXOL 300 MG/ML  SOLN COMPARISON:  Chest radiograph 07/22/2015 showing right upper lobe opacity FINDINGS: There is mild centrilobular emphysematous  change. There is no evidence of endobronchial mass or mucous plug. The right upper lobe bronchus appear extrinsically compressed by a large soft tissue mass. There is a sub carinal heterogeneous enhancing mass which extends into the right hilum and perihilar right upper lobe. There is associated collapse of the right upper lobe in the right suprahilar region, mass measures about 37 x 50 mm. In the sub carinal region, the mass measures about 23 x 28 mm. Mass and into the anterior mediastinum and into the aortopulmonary window, worse soft tissue mass component measures 11 x 22 mm. There is a microlobulated mass image number 42 in the right lower lobe, measuring 15 x 9 mm. There is a subpleural nodule image number 51 right lower lobe measuring 4 mm. There is an area of irregular ground-glass attenuation laterally in the right lower lobe measuring about 1 cm, and in the inferior right middle lobe on image number 37 there is a 5 mm nodule. There is mild volume loss in the inferior lingula. There is a sub solid pulmonary nodule laterally in the left lung base on image number 56 measuring 7 mm. Thyroid is normal. Thoracic aorta is normal. No abnormalities involving pulmonary arteries. Minimal pericardial effusion. No pleural effusion. Images through the upper abdomen demonstrate innumerable liver masses with peripheral enhancement, the largest in the left lobe measuring 3 cm. There is a left adrenal gland mass measuring about 3 cm. There is mild fullness of the right adrenal gland without discrete mass. There is sclerosis of numerous lower cervical and upper thoracic vertebra possibly representing metastasis. IMPRESSION: 1. Right perihilar mass which extends through the mediastinum into the sub carinal and aortopulmonary window regions. Postobstructive volume loss right upper lobe. Findings consistent with malignancy. 2. Evidence of pulmonary parenchymal metastasis 3. Hepatic and adrenal metastasis 4. Possible bone  metastasis to the spine. Electronically Signed   By: RSkipper ClicheM.D.   On: 07/22/2015 19:27   Ct Abdomen Pelvis W Contrast  07/23/2015  CLINICAL DATA:  Abdominal pain and constipation, known history of right lung mass EXAM: CT ABDOMEN AND PELVIS WITH CONTRAST TECHNIQUE: Multidetector CT imaging of the abdomen and pelvis was performed using the standard protocol following bolus administration of intravenous contrast. CONTRAST:  1025mOMNIPAQUE IOHEXOL 300 MG/ML  SOLN COMPARISON:  None. FINDINGS: Lung bases are free of acute infiltrate or sizable effusion. There are multiple hypodense lesions identified throughout the liver. The largest of these lies in the left lobe of the liver best seen on image number 13 of series 2 measuring approximately 30 mm in dimension. These are consistent with metastatic disease. The gallbladder is well distended. No biliary ductal dilatation is seen. Hypodensity is noted in the region of the porta hepatis consistent with lymphadenopathy. This is best seen on image number 20 of series 2. The kidneys demonstrate cystic change on the right. The right adrenal gland is within normal limits. The left adrenal gland is enlarged with a 2.7 cm hypodense mass lesion consistent with metastatic disease. There are 2 hypodense areas within the head and uncinate process of the pancreas. They appear to be separate but may represent one lesion infiltrating in the central portion of the pancreas. This may represent metastatic disease  although the possibility of a primary neoplasm within the pancreas would deserve consideration as well. The appendix is well visualized and air filled. No acute appendicitis is identified. The infrarenal aorta shows no aneurysmal dilatation. A retro aortic left renal vein is seen. The bladder is well distended. The prostate is enlarged with central calcification. No significant pelvic lymphadenopathy is seen. No definitive bony abnormality is seen IMPRESSION: Multiple  lesions within the liver consistent with metastatic disease. Additionally some porta hepatis lymphadenopathy is noted. There are changes in the region of the head and uncinate process of the pancreas consistent with a focal mass lesion. It is uncertain whether this represents 2 separate lesions or a larger lesion. It appears intimately associated with the porta hepatis lymphadenopathy. This may represent a primary pancreatic neoplasm although the possibility of metastatic disease deserves consideration given the findings in the chest. 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 Electronically Signed   By: Inez Catalina M.D.   On: 07/23/2015 15:21    Labs:  CBC:  Recent Labs  07/22/15 1323  WBC 6.8  HGB 13.2  HCT 41.2  PLT 358    COAGS:  Recent Labs  07/22/15 2200  INR 1.01  APTT 27    BMP:  Recent Labs  07/22/15 1323  NA 136  K 4.8  CL 97*  CO2 29  GLUCOSE 108*  BUN 12  CALCIUM 10.0  CREATININE 0.90  GFRNONAA >60  GFRAA >60    LIVER FUNCTION TESTS:  Recent Labs  07/22/15 2200  BILITOT 0.5  AST 35  ALT 15*  ALKPHOS 71  PROT 7.1  ALBUMIN 3.5    TUMOR MARKERS: No results for input(s): AFPTM, CEA, CA199, CHROMGRNA in the last 8760 hours.  Assessment and Plan: 74 year old black male with long history of tobacco use, COPD, weight loss, dyspnea, abdominal pain, and imaging findings of right perihilar lung mass with extension through the mediastinum into the subcarinal and aortopulmonary window regions, pulmonary parenchymal metastasis, pancreatic mass, hepatic and adrenal metastasis as well as possible bone metastasis to the spine. Imaging studies have been reviewed by Dr. Laurence Ferrari. Most feasible site to biopsy at this time would be a liver lesion.Risks and benefits discussed with the patient/daughter including, but not limited to bleeding, infection, damage to adjacent structures or low yield requiring additional tests.All of the patient's questions were  answered, patient is agreeable to proceed.Consent signed and in chart. Biopsy tentatively planned for 10/26.     Thank you for this interesting consult.  I greatly enjoyed meeting Benicio Manna and look forward to participating in their care.  A copy of this report was sent to the requesting provider on this date.  Signed: D. Rowe Robert 07/23/2015, 4:26 PM   I spent a total of 20 minutes in face to face in clinical consultation, greater than 50% of which was counseling/coordinating care for ultrasound-guided liver lesion biopsy

## 2015-07-23 NOTE — Progress Notes (Signed)
Patient ID: Tyler Potter, male   DOB: 02-Apr-1941, 74 y.o.   MRN: [id number]  Aware of request for IR review for biopsy Will wait for CT Ab/Pelvis results Radiologist will review with recommendations

## 2015-07-23 NOTE — H&P (Signed)
Expand All Collapse All                Expand All Collapse All  [facility] History and Physical  Tyler Potter MLY:[id number] DOB: 11-15-1940 DOA: 07/22/2015  Referring physician: [facility]  PCP: No primary care provider on file.   Chief Complaint: cough/ SOB  HPI: Tyler Potter is a 74 y.o. male with no PMH, has not seen a doctor in years, came to ED with chest pain, SOB and cough for the past month or so, also weight loss. Labs normal, no home meds, does not see doctors. Daughter said she has been trying to "get him to come in" for some time now. CXR in ED showing R upper lobe collapse, suspected underlying lung mass. And +COPD. Heart normal.  Chest CT shows> a right perihilar mass which extends through the mediastinum into the sub carinal and aortopulmonary window regions. Postobstructive volume loss right upper lobe. Findings consistent with malignancy. Also evidence of pulmonary parenchymal metastasis, hepatic and adrenal metastasis and possible bone metastasis to the spine.  Patient's main complaint is chest pain and SOB with cough of whitish sputum in abundance. Nofevers.   Chart review: none  ROS no fevers, no abd pain intermittent constipation + no diarrhea no purulent sputum no wheezing no medical hx no joint pain  Where does patient live hoem with family  Can patient participate in ADLs? For the most part  Past Medical History History reviewed. No pertinent past medical history. Past Surgical History History reviewed. No pertinent past surgical history. Family History History reviewed. No pertinent family history.  Social History reports that he has quit smoking. He does not have any smokeless tobacco history on file. He reports that he does not drink alcohol or use illicit drugs. Allergies No Known Allergies Home medications Prior to Admission medications   Medication Sig Start Date End Date  Taking? Authorizing Provider  aspirin 325 MG EC tablet Take 325 mg by mouth every 6 (six) hours as needed for pain.   Yes Historical Provider, MD  guaifenesin (ROBITUSSIN) 100 MG/5ML syrup Take 200 mg by mouth 3 (three) times daily as needed for cough.   Yes Historical Provider, MD   Liver Function Tests  Last Labs    No results for input(s): AST, ALT, ALKPHOS, BILITOT, PROT, ALBUMIN in the last 168 hours.    Last Labs    No results for input(s): LIPASE, AMYLASE in the last 168 hours.   CBC  Last Labs     Recent Labs Lab 07/22/15 1323  WBC 6.8  HGB 13.2  HCT 41.2  MCV 85.5  PLT 358     Basic Metabolic Panel  Last Labs     Recent Labs Lab 07/22/15 1323  NA 136  K 4.8  CL 97*  CO2 29  GLUCOSE 108*  BUN 12  CREATININE 0.90  CALCIUM 10.0       Filed Vitals:   07/22/15 1900 07/22/15 2054 07/22/15 2100 07/22/15 2130  BP: 137/79 138/88 125/78 135/83  Pulse: 94 75 78 72  Temp:      TempSrc:      Resp:  '18 17 16  '$ SpO2: 100% 97% 100% 97%   Exam: Small-framed AAM in no distress. Wt loss evident.  No rash, cyanosis or gangrene Sclera anicteric, throat clear No jvd or nodes in neck Chest decreased BS throughout, no wheezing, occ rhonchi RRR no MRG Abd is soft ntnd no mass or ascites GU  normal male Ext no LE edema, no wounds/ amputations/ ulcers Neuro is alert, O x3 , pleasant  CXR (independently reviewed) > R upper lobe collapse CT chest > see above in HPI Home medications > none Labs reviewed - wnl  Assessment: 1. 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 Chest pain/ SOB/ cough - no PNA or fever, prob due to malignancy 3. COPD  4. Smoker  Plan - admit, get ONC consult in am   DVT Prophylaxis lovenox  Code Status: full  Family Communication: daughter at bedside   Disposition Plan: home when stable    Sol Blazing [facility] Pager [phone] Cell [phone]  If 7PM-7AM, please contact night-coverage [email or web address] Password [email or web address] 07/22/2015, 9:59 PM         Deleted by: Roney Jaffe, MD at 07/23/2015 1:05 AM  Chart Correction History     Date/Time User Action     07/23/2015 1:05 AM Roney Jaffe, MD Delete Data     Routing History     Date/Time From To Method   07/22/2015 10:44 PM Roney Jaffe, MD Roney Jaffe, MD Fax                Revision History     Date/Time User Provider Type Action   07/23/2015 12:16 PM Roney Jaffe, MD Physician Addend   07/23/2015 12:16 PM Roney Jaffe, MD Physician Sign   View Details Report       Routing History     Date/Time From To Method   07/23/2015 12:16 PM Roney Jaffe, MD Roney Jaffe, MD Fax

## 2015-07-24 DIAGNOSIS — K7689 Other specified diseases of liver: Secondary | ICD-10-CM

## 2015-07-24 DIAGNOSIS — Z7982 Long term (current) use of aspirin: Secondary | ICD-10-CM | POA: Diagnosis not present

## 2015-07-24 DIAGNOSIS — C781 Secondary malignant neoplasm of mediastinum: Secondary | ICD-10-CM | POA: Diagnosis present

## 2015-07-24 DIAGNOSIS — C787 Secondary malignant neoplasm of liver and intrahepatic bile duct: Secondary | ICD-10-CM | POA: Diagnosis present

## 2015-07-24 DIAGNOSIS — C3491 Malignant neoplasm of unspecified part of right bronchus or lung: Secondary | ICD-10-CM | POA: Diagnosis present

## 2015-07-24 DIAGNOSIS — J439 Emphysema, unspecified: Secondary | ICD-10-CM | POA: Diagnosis not present

## 2015-07-24 DIAGNOSIS — G4733 Obstructive sleep apnea (adult) (pediatric): Secondary | ICD-10-CM | POA: Diagnosis present

## 2015-07-24 DIAGNOSIS — Z87891 Personal history of nicotine dependence: Secondary | ICD-10-CM | POA: Diagnosis not present

## 2015-07-24 DIAGNOSIS — C7972 Secondary malignant neoplasm of left adrenal gland: Secondary | ICD-10-CM | POA: Diagnosis present

## 2015-07-24 DIAGNOSIS — Z578 Occupational exposure to other risk factors: Secondary | ICD-10-CM | POA: Diagnosis not present

## 2015-07-24 DIAGNOSIS — K869 Disease of pancreas, unspecified: Secondary | ICD-10-CM

## 2015-07-24 DIAGNOSIS — Z7729 Contact with and (suspected ) exposure to other hazardous substances: Secondary | ICD-10-CM | POA: Diagnosis present

## 2015-07-24 DIAGNOSIS — R079 Chest pain, unspecified: Secondary | ICD-10-CM | POA: Diagnosis present

## 2015-07-24 DIAGNOSIS — R918 Other nonspecific abnormal finding of lung field: Secondary | ICD-10-CM | POA: Diagnosis not present

## 2015-07-24 DIAGNOSIS — Z681 Body mass index (BMI) 19 or less, adult: Secondary | ICD-10-CM | POA: Diagnosis not present

## 2015-07-24 DIAGNOSIS — E43 Unspecified severe protein-calorie malnutrition: Secondary | ICD-10-CM | POA: Diagnosis present

## 2015-07-24 DIAGNOSIS — J449 Chronic obstructive pulmonary disease, unspecified: Secondary | ICD-10-CM | POA: Diagnosis present

## 2015-07-24 DIAGNOSIS — C7951 Secondary malignant neoplasm of bone: Secondary | ICD-10-CM | POA: Diagnosis present

## 2015-07-24 DIAGNOSIS — C7889 Secondary malignant neoplasm of other digestive organs: Secondary | ICD-10-CM | POA: Diagnosis present

## 2015-07-24 DIAGNOSIS — Z23 Encounter for immunization: Secondary | ICD-10-CM | POA: Diagnosis not present

## 2015-07-24 MED ORDER — POLYETHYLENE GLYCOL 3350 17 G PO PACK: 17.0000 g | PACK | Freq: Every day | ORAL | Status: AC | PRN

## 2015-07-24 MED ORDER — HYDROCODONE-ACETAMINOPHEN 5-325 MG PO TABS: 1.0000 | ORAL_TABLET | Freq: Four times a day (QID) | ORAL | Status: DC | PRN

## 2015-07-24 MED ORDER — SENNOSIDES-DOCUSATE SODIUM 8.6-50 MG PO TABS: 2.0000 | ORAL_TABLET | Freq: Every day | ORAL | Status: AC

## 2015-07-24 NOTE — Progress Notes (Signed)
Liver biopsy cancelled for today by Dr Laurence Ferrari  as patient was given his Lovenox this morning. Patient's nurse [place] aware and she is aware to make sure his Lovenox is on hold for tomorrow. Procedure cancellation explained to patient.

## 2015-07-24 NOTE — Progress Notes (Addendum)
[facility]                                                                              Patient Demographics  Tyler Potter, is a 74 y.o. male, DOB - April 09, 1941, TZG:[id number]  Admit date - 07/22/2015   Admitting Physician Roney Jaffe, MD  Outpatient Primary MD for the patient is No primary care provider on file.  LOS - 2   Chief Complaint  Patient presents with  . Chest Pain  . Cough       Brief HPI   HPI on 07/22/2015 by Dr. Roney Jaffe Tyler Potter is a 74 y.o. male with no PMH, has not seen a doctor in years, came to ED with chest pain, SOB and cough for the past month or so, also weight loss. Labs normal, no home meds, does not see doctors. Daughter said she has been trying to "get him to come in" for some time now. CXR in ED showing R upper lobe collapse, suspected underlying lung mass. And +COPD. Heart normal. Chest CT shows> a right perihilar mass which extends through the mediastinum into the sub carinal and aortopulmonary window regions. Postobstructive volume loss right upper lobe. Findings consistent with malignancy. Also evidence of pulmonary parenchymal metastasis, hepatic and adrenal metastasis and possible bone metastasis to the spine. Patient's main complaint is chest pain and SOB with cough of whitish sputum in abundance. No fevers.    Assessment & Plan   Dyspnea secondary to lung mass -Noted on CT chest w/ contrast: Right. Another mass which extends through the mediastinum into the subcarinal and aortopulmonary windows, postobstructive volume loss in the right upper lung, hepatic and adrenal metastasis, pulmonary parenchymal metastasis, bone metastasis to the spine -CT abdomen and pelvis showed multiple lesions within the liver consistent with metastatic disease, porta hepatis lymphadenopathy, focal mass lesion in the head and uncinate process of pancreas could represent primary pancreatic neoplasm although the possibility of  metastatic disease. -Oncology, Dr. Alen Blew, consulted and appreciated -IR consulted for possible biopsy, biopsy planned 10/26 -Patient has no white count, no leukocytosis unlikely shortness of breath related to pneumonia or infection - Called [facility] for appointments for PET scan and follow-up appointments with Dr. Alen Blew  COPD -Currently compensated, no wheezing  Tobacco abuse -Patient states he stopped smoking approximately one year ago.  Malnutrition -Likely secondary to the above, nutrition consult    Code Status: Full code  Family Communication: Discussed in detail with the patient, all imaging results, lab results explained to the patient    Disposition Plan: Hopefully DC home in a.m., need biopsy done today and appointments for further workup and follow-up at [place]  Time Spent in minutes   25 minutes  Procedures  CT chest CT abdomen  Consults   Oncology Interventional radiology  DVT Prophylaxis  Lovenox  Medications  Scheduled Meds: . enoxaparin (LOVENOX) injection  30 mg Subcutaneous Q24H   Continuous Infusions:  PRN Meds:.acetaminophen **OR** acetaminophen, bisacodyl, guaifenesin, HYDROcodone-acetaminophen, magnesium hydroxide, ondansetron **OR** ondansetron (ZOFRAN) IV, sodium phosphate   Antibiotics   Anti-infectives    None  Subjective:   Ashyr Hedgepath was seen and examined today.  Patient denies dizziness, chest pain,  abdominal pain, N/V/D/C, new weakness, numbess, tingling. No acute events overnight.  Shortness of breath chronic, no worsening today  Objective:   Blood pressure 136/88, pulse 61, temperature 97 F (36.1 C), temperature source Oral, resp. rate 18, height '5\' 9"'$  (1.753 m), weight 50.077 kg (110 lb 6.4 oz), SpO2 98 %.  Wt Readings from Last 3 Encounters:  07/24/15 50.077 kg (110 lb 6.4 oz)     Intake/Output Summary (Last 24 hours) at 07/24/15 1239 Last data filed at 07/24/15 1052  Gross  per 24 hour  Intake   1243 ml  Output    400 ml  Net    843 ml    Exam  General: Alert and oriented x 3, NAD, thin  HEENT:  PERRLA, EOMI, Anicteric Sclera, mucous membranes moist.   Neck: Supple, no JVD, no masses  CVS: S1 S2 auscultated, no rubs, murmurs or gallops. Regular rate and rhythm.  Respiratory: Diminished lung sounds, no wheezing, rales or rhonchi  Abdomen: Soft, nontender, nondistended, + bowel sounds  Ext: no cyanosis clubbing or edema  Neuro: AAOx3, Cr N's II- XII. Strength 5/5 upper and lower extremities bilaterally  Skin: No rashes  Psych: Normal affect and demeanor, alert and oriented x3    Data Review   Micro Results No results found for this or any previous visit (from the past 240 hour(s)).  Radiology Reports Dg Chest 2 View  07/22/2015  CLINICAL DATA:  Productive cough for the past 2 weeks. Mid chest pain. Ex-smoker. EXAM: CHEST  2 VIEW COMPARISON:  None. FINDINGS: Right upper lobe collapse. Bilateral nipple shadows. Mild hyperexpansion of the lungs with mild central peribronchial thickening. Normal sized heart. Thoracolumbar spine degenerative changes. IMPRESSION: 1. Right upper lobe collapse. This is concerning for a centrally obstructing mass or mucus plug. A chest CT with contrast may provide useful additional information. 2. Mild changes of COPD and chronic bronchitis. Electronically Signed   By: Claudie Revering M.D.   On: 07/22/2015 13:54   Ct Chest W Contrast  07/22/2015  CLINICAL DATA:  Productive cough for 1 month, shortness of breath, abnormal chest radiograph, smoking history EXAM: CT CHEST WITH CONTRAST TECHNIQUE: Multidetector CT imaging of the chest was performed during intravenous contrast administration. CONTRAST:  32m OMNIPAQUE IOHEXOL 300 MG/ML  SOLN COMPARISON:  Chest radiograph 07/22/2015 showing right upper lobe opacity FINDINGS: There is mild centrilobular emphysematous change. There is no evidence of endobronchial mass or mucous plug.  The right upper lobe bronchus appear extrinsically compressed by a large soft tissue mass. There is a sub carinal heterogeneous enhancing mass which extends into the right hilum and perihilar right upper lobe. There is associated collapse of the right upper lobe in the right suprahilar region, mass measures about 37 x 50 mm. In the sub carinal region, the mass measures about 23 x 28 mm. Mass and into the anterior mediastinum and into the aortopulmonary window, worse soft tissue mass component measures 11 x 22 mm. There is a microlobulated mass image number 42 in the right lower lobe, measuring 15 x 9 mm. There is a subpleural nodule image number 51 right lower lobe measuring 4 mm. There is an area of irregular ground-glass attenuation laterally in the right lower lobe measuring about 1 cm, and in the inferior right middle lobe on image number 37 there is a 5 mm nodule. There is mild volume loss in the inferior  lingula. There is a sub solid pulmonary nodule laterally in the left lung base on image number 56 measuring 7 mm. Thyroid is normal. Thoracic aorta is normal. No abnormalities involving pulmonary arteries. Minimal pericardial effusion. No pleural effusion. Images through the upper abdomen demonstrate innumerable liver masses with peripheral enhancement, the largest in the left lobe measuring 3 cm. There is a left adrenal gland mass measuring about 3 cm. There is mild fullness of the right adrenal gland without discrete mass. There is sclerosis of numerous lower cervical and upper thoracic vertebra possibly representing metastasis. IMPRESSION: 1. Right perihilar mass which extends through the mediastinum into the sub carinal and aortopulmonary window regions. Postobstructive volume loss right upper lobe. Findings consistent with malignancy. 2. Evidence of pulmonary parenchymal metastasis 3. Hepatic and adrenal metastasis 4. Possible bone metastasis to the spine. Electronically Signed   By: Skipper Cliche M.D.    On: 07/22/2015 19:27   Ct Abdomen Pelvis W Contrast  07/23/2015  CLINICAL DATA:  Abdominal pain and constipation, known history of right lung mass EXAM: CT ABDOMEN AND PELVIS WITH CONTRAST TECHNIQUE: Multidetector CT imaging of the abdomen and pelvis was performed using the standard protocol following bolus administration of intravenous contrast. CONTRAST:  189m OMNIPAQUE IOHEXOL 300 MG/ML  SOLN COMPARISON:  None. FINDINGS: Lung bases are free of acute infiltrate or sizable effusion. There are multiple hypodense lesions identified throughout the liver. The largest of these lies in the left lobe of the liver best seen on image number 13 of series 2 measuring approximately 30 mm in dimension. These are consistent with metastatic disease. The gallbladder is well distended. No biliary ductal dilatation is seen. Hypodensity is noted in the region of the porta hepatis consistent with lymphadenopathy. This is best seen on image number 20 of series 2. The kidneys demonstrate cystic change on the right. The right adrenal gland is within normal limits. The left adrenal gland is enlarged with a 2.7 cm hypodense mass lesion consistent with metastatic disease. There are 2 hypodense areas within the head and uncinate process of the pancreas. They appear to be separate but may represent one lesion infiltrating in the central portion of the pancreas. This may represent metastatic disease although the possibility of a primary neoplasm within the pancreas would deserve consideration as well. The appendix is well visualized and air filled. No acute appendicitis is identified. The infrarenal aorta shows no aneurysmal dilatation. A retro aortic left renal vein is seen. The bladder is well distended. The prostate is enlarged with central calcification. No significant pelvic lymphadenopathy is seen. No definitive bony abnormality is seen IMPRESSION: Multiple lesions within the liver consistent with metastatic disease. Additionally  some porta hepatis lymphadenopathy is noted. There are changes in the region of the head and uncinate process of the pancreas consistent with a focal mass lesion. It is uncertain whether this represents 2 separate lesions or a larger lesion. It appears intimately associated with the porta hepatis lymphadenopathy. This may represent a primary pancreatic neoplasm although the possibility of metastatic disease deserves consideration given the findings in the chest. 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 Electronically Signed   By: MInez CatalinaM.D.   On: 07/23/2015 15:21    CBC  Recent Labs Lab 07/22/15 1323  WBC 6.8  HGB 13.2  HCT 41.2  PLT 358  MCV 85.5  MCH 27.4  MCHC 32.0  RDW 13.8    Chemistries   Recent Labs Lab 07/22/15 1323 07/22/15 2200  NA 136  --  K 4.8  --   CL 97*  --   CO2 29  --   GLUCOSE 108*  --   BUN 12  --   CREATININE 0.90  --   CALCIUM 10.0  --   AST  --  35  ALT  --  15*  ALKPHOS  --  71  BILITOT  --  0.5   ------------------------------------------------------------------------------------------------------------------ estimated creatinine clearance is 49.5 mL/min (by C-G formula based on Cr of 0.9). ------------------------------------------------------------------------------------------------------------------ No results for input(s): HGBA1C in the last 72 hours. ------------------------------------------------------------------------------------------------------------------ No results for input(s): CHOL, HDL, LDLCALC, TRIG, CHOLHDL, LDLDIRECT in the last 72 hours. ------------------------------------------------------------------------------------------------------------------ No results for input(s): TSH, T4TOTAL, T3FREE, THYROIDAB in the last 72 hours.  Invalid input(s): FREET3 ------------------------------------------------------------------------------------------------------------------ No results for input(s): VITAMINB12, FOLATE,  FERRITIN, TIBC, IRON, RETICCTPCT in the last 72 hours.  Coagulation profile  Recent Labs Lab 07/22/15 2200  INR 1.01    No results for input(s): DDIMER in the last 72 hours.  Cardiac Enzymes No results for input(s): CKMB, TROPONINI, MYOGLOBIN in the last 168 hours.  Invalid input(s): CK ------------------------------------------------------------------------------------------------------------------ Invalid input(s): POCBNP  No results for input(s): GLUCAP in the last 72 hours.   Shamere Dilworth M.D. [facility] 07/24/2015, 12:39 PM  Pager: [phone] Between 7am to 7pm - call Pager - [phone]  After 7pm go to [email or web address] - password [email or web address]  Call night coverage person covering after 7pm

## 2015-07-24 NOTE — Progress Notes (Signed)
Initial Nutrition Assessment  DOCUMENTATION CODES:   Underweight, Severe malnutrition in context of chronic illness  INTERVENTION:  Per Diet Advancement -->  Ensure Enlive po BID, each supplement provides 350 kcal and 20 grams of protein   NUTRITION DIAGNOSIS:   Malnutrition related to chronic illness, poor appetite as evidenced by per patient/family report, severe depletion of muscle mass, severe depletion of body fat.   GOAL:   Patient will meet greater than or equal to 90% of their needs  MONITOR:   PO intake, Supplement acceptance, Labs, Weight trends, I & O's  REASON FOR ASSESSMENT:   Consult Assessment of nutrition requirement/status  ASSESSMENT:   Tyler Potter is a 74 y.o. male with no PMH, has not seen a doctor in years, came to ED with chest pain, SOB and cough for the past month or so, also weight loss. Labs normal, no home meds, does not see doctors. Daughter said she has been trying to "get him to come in" for some time now. CXR in ED showing R upper lobe collapse, suspected underlying lung mass. And +COPD. Heart normal.  Patient states he had a good appetite up until about 2 weeks ago. Says that he was eating well but was still consistently losing weight.   States usual weight is about 140# currently he is 110, could not give timespan in which weight loss occurred, but states clothes are fitting differently.  MD workup points to Lung CA with Mets, consistent with report of good app but consistent wt loss.   Pt states he drinks ensure at home, will provide to increase protein in take, maintain muscle, prevent further wt loss.  Nutrition-Focused physical exam completed. Findings are severe fat depletion, severe muscle depletion, and no edema.     Diet Order:  Diet NPO time specified Except for: Sips with Meds  Skin:  Reviewed, no issues  Last BM:  07/23/2015  Height:   Ht Readings from Last 1 Encounters:  07/22/15 '5\' 9"'$  (1.753 m)    Weight:    Wt Readings from Last 1 Encounters:  07/24/15 110 lb 6.4 oz (50.077 kg)    Ideal Body Weight:  72.72 kg  BMI:  Body mass index is 16.3 kg/(m^2).  Estimated Nutritional Needs:   Kcal:  1750-2000 calories  Protein:  60 - 75 grams  Fluid:  >/= 1.75L  EDUCATION NEEDS:   No education needs identified at this time  Satira Anis. Fable Huisman, MS, RD LDN After Hours/Weekend Pager [phone]

## 2015-07-25 ENCOUNTER — Observation Stay (HOSPITAL_COMMUNITY): Payer: Medicare Other

## 2015-07-25 DIAGNOSIS — R918 Other nonspecific abnormal finding of lung field: Secondary | ICD-10-CM | POA: Diagnosis not present

## 2015-07-25 DIAGNOSIS — K7689 Other specified diseases of liver: Secondary | ICD-10-CM | POA: Diagnosis not present

## 2015-07-25 DIAGNOSIS — C3491 Malignant neoplasm of unspecified part of right bronchus or lung: Secondary | ICD-10-CM | POA: Diagnosis not present

## 2015-07-25 DIAGNOSIS — J439 Emphysema, unspecified: Secondary | ICD-10-CM | POA: Diagnosis not present

## 2015-07-25 DIAGNOSIS — R079 Chest pain, unspecified: Secondary | ICD-10-CM | POA: Diagnosis not present

## 2015-07-25 MED ORDER — LIDOCAINE HCL (PF) 1 % IJ SOLN
INTRAMUSCULAR | Status: AC
  Filled 2015-07-25: qty 10

## 2015-07-25 MED ORDER — GUAIFENESIN-DM 100-10 MG/5ML PO SYRP
5.0000 mL | ORAL_SOLUTION | ORAL | Status: DC | PRN
  Administered 2015-07-25: 5 mL via ORAL
  Filled 2015-07-25: qty 5

## 2015-07-25 MED ORDER — GUAIFENESIN-DM 100-10 MG/5ML PO SYRP: 5.0000 mL | ORAL_SOLUTION | ORAL | Status: AC | PRN

## 2015-07-25 MED ORDER — FENTANYL CITRATE (PF) 100 MCG/2ML IJ SOLN
INTRAMUSCULAR | Status: AC
  Filled 2015-07-25: qty 2

## 2015-07-25 MED ORDER — FENTANYL CITRATE (PF) 100 MCG/2ML IJ SOLN
INTRAMUSCULAR | Status: AC | PRN
  Administered 2015-07-25: 50 ug via INTRAVENOUS

## 2015-07-25 MED ORDER — MIDAZOLAM HCL 2 MG/2ML IJ SOLN
INTRAMUSCULAR | Status: AC
  Filled 2015-07-25: qty 2

## 2015-07-25 MED ORDER — BENZONATATE 100 MG PO CAPS
100.0000 mg | ORAL_CAPSULE | Freq: Three times a day (TID) | ORAL | Status: DC | PRN
  Administered 2015-07-25: 100 mg via ORAL
  Filled 2015-07-25: qty 1

## 2015-07-25 MED ORDER — MIDAZOLAM HCL 2 MG/2ML IJ SOLN
INTRAMUSCULAR | Status: AC | PRN
  Administered 2015-07-25: 1 mg via INTRAVENOUS

## 2015-07-25 MED ORDER — BENZONATATE 100 MG PO CAPS: 100.0000 mg | ORAL_CAPSULE | Freq: Three times a day (TID) | ORAL | Status: AC | PRN

## 2015-07-25 NOTE — Procedures (Addendum)
US liver lesion core bx 18g x2 to surg path No complication No blood loss. See complete dictation in [facility].

## 2015-07-25 NOTE — Progress Notes (Signed)
[facility]                                                                              Patient Demographics  Tyler Potter, is a 74 y.o. male, DOB - 03-28-1941, ZOX:[id number]  Admit date - 07/22/2015   Admitting Physician Roney Jaffe, MD  Outpatient Primary MD for the patient is No primary care provider on file.  LOS - 3   Chief Complaint  Patient presents with  . Chest Pain  . Cough       Brief HPI   HPI on 07/22/2015 by Dr. Roney Jaffe Tyler Potter is a 74 y.o. male with no PMH, has not seen a doctor in years, came to ED with chest pain, SOB and cough for the past month or so, also weight loss. Labs normal, no home meds, does not see doctors. Daughter said she has been trying to "get him to come in" for some time now. CXR in ED showing R upper lobe collapse, suspected underlying lung mass. And +COPD. Heart normal. Chest CT shows> a right perihilar mass which extends through the mediastinum into the sub carinal and aortopulmonary window regions. Postobstructive volume loss right upper lobe. Findings consistent with malignancy. Also evidence of pulmonary parenchymal metastasis, hepatic and adrenal metastasis and possible bone metastasis to the spine. Patient's main complaint is chest pain and SOB with cough of whitish sputum in abundance. No fevers.    Assessment & Plan   Dyspnea secondary to lung mass -Noted on CT chest w/ contrast: Right. Another mass which extends through the mediastinum into the subcarinal and aortopulmonary windows, postobstructive volume loss in the right upper lung, hepatic and adrenal metastasis, pulmonary parenchymal metastasis, bone metastasis to the spine -CT abdomen and pelvis showed multiple lesions within the liver consistent with metastatic disease, porta hepatis lymphadenopathy, focal mass lesion in the head and uncinate process of pancreas could represent primary pancreatic neoplasm although the possibility of  metastatic disease. -Oncology, Dr. Alen Blew, consulted and appreciated -IR consulted for possible biopsy, biopsy planned 10/27, canceled on 10/26 as patient had received a dose of Lovenox. I discussed with IR, planned today later this afternoon. -Patient has no white count, no leukocytosis unlikely shortness of breath related to pneumonia or infection -  follow-up appointments with Dr. Alen Blew scheduled on 11/2 at 12 AM  COPD -Currently compensated, no wheezing  Tobacco abuse -Patient states he stopped smoking approximately one year ago.  Malnutrition -Likely secondary to the above, nutrition consult    Code Status: Full code  Family Communication: Discussed in detail with the patient, all imaging results, lab results explained to the patient    Disposition Plan: If biopsy done earlier, possible DC today versus in  a.m.  Time Spent in minutes   25 minutes  Procedures  CT chest CT abdomen  Consults   Oncology Interventional radiology  DVT Prophylaxis  Lovenox  Medications  Scheduled Meds:   Continuous Infusions:  PRN Meds:.acetaminophen **OR** acetaminophen, benzonatate, bisacodyl, guaiFENesin-dextromethorphan, HYDROcodone-acetaminophen, magnesium hydroxide, ondansetron **OR** ondansetron (ZOFRAN) IV, sodium phosphate   Antibiotics   Anti-infectives    None  Subjective:   Tyler Potter was seen and examined today.  Patient denies dizziness, chest pain,  abdominal pain, N/V/D/C, new weakness, numbess, tingling. No acute events overnight.  Awaiting biopsy  Objective:   Blood pressure 112/91, pulse 62, temperature 97.9 F (36.6 C), temperature source Oral, resp. rate 18, height '5\' 9"'$  (1.753 m), weight 50.077 kg (110 lb 6.4 oz), SpO2 98 %.  Wt Readings from Last 3 Encounters:  07/24/15 50.077 kg (110 lb 6.4 oz)     Intake/Output Summary (Last 24 hours) at 07/25/15 1034 Last data filed at 07/25/15 0347  Gross per 24 hour  Intake    480 ml  Output     500 ml  Net    -20 ml    Exam  General: Alert and oriented x 3, NAD, thin  HEENT:  PERRLA, EOMI, Anicteric Sclera,   Neck: Supple, no JVD, no masses  CVS: S1 S2 clear, RRR  Respiratory: Diminished lung sounds, no wheezing, rales or rhonchi  Abdomen: Soft, nontender, nondistended, + bowel sounds  Ext: no cyanosis clubbing or edema  Neuro: no new deficits  Skin: No rashes  Psych: Normal affect and demeanor, alert and oriented x3    Data Review   Micro Results No results found for this or any previous visit (from the past 240 hour(s)).  Radiology Reports Dg Chest 2 View  07/22/2015  CLINICAL DATA:  Productive cough for the past 2 weeks. Mid chest pain. Ex-smoker. EXAM: CHEST  2 VIEW COMPARISON:  None. FINDINGS: Right upper lobe collapse. Bilateral nipple shadows. Mild hyperexpansion of the lungs with mild central peribronchial thickening. Normal sized heart. Thoracolumbar spine degenerative changes. IMPRESSION: 1. Right upper lobe collapse. This is concerning for a centrally obstructing mass or mucus plug. A chest CT with contrast may provide useful additional information. 2. Mild changes of COPD and chronic bronchitis. Electronically Signed   By: Claudie Revering M.D.   On: 07/22/2015 13:54   Ct Chest W Contrast  07/22/2015  CLINICAL DATA:  Productive cough for 1 month, shortness of breath, abnormal chest radiograph, smoking history EXAM: CT CHEST WITH CONTRAST TECHNIQUE: Multidetector CT imaging of the chest was performed during intravenous contrast administration. CONTRAST:  50m OMNIPAQUE IOHEXOL 300 MG/ML  SOLN COMPARISON:  Chest radiograph 07/22/2015 showing right upper lobe opacity FINDINGS: There is mild centrilobular emphysematous change. There is no evidence of endobronchial mass or mucous plug. The right upper lobe bronchus appear extrinsically compressed by a large soft tissue mass. There is a sub carinal heterogeneous enhancing mass which extends into the right hilum  and perihilar right upper lobe. There is associated collapse of the right upper lobe in the right suprahilar region, mass measures about 37 x 50 mm. In the sub carinal region, the mass measures about 23 x 28 mm. Mass and into the anterior mediastinum and into the aortopulmonary window, worse soft tissue mass component measures 11 x 22 mm. There is a microlobulated mass image number 42 in the right lower lobe, measuring 15 x 9 mm. There is a subpleural nodule image number 51 right lower lobe measuring 4 mm. There is an area of irregular ground-glass attenuation laterally in the right lower lobe measuring about 1 cm, and in the inferior right middle lobe on image number 37 there is a 5 mm nodule. There is mild volume loss in the inferior lingula. There is a sub solid pulmonary nodule laterally in the left lung base on image number 56 measuring 7 mm. Thyroid is normal.  Thoracic aorta is normal. No abnormalities involving pulmonary arteries. Minimal pericardial effusion. No pleural effusion. Images through the upper abdomen demonstrate innumerable liver masses with peripheral enhancement, the largest in the left lobe measuring 3 cm. There is a left adrenal gland mass measuring about 3 cm. There is mild fullness of the right adrenal gland without discrete mass. There is sclerosis of numerous lower cervical and upper thoracic vertebra possibly representing metastasis. IMPRESSION: 1. Right perihilar mass which extends through the mediastinum into the sub carinal and aortopulmonary window regions. Postobstructive volume loss right upper lobe. Findings consistent with malignancy. 2. Evidence of pulmonary parenchymal metastasis 3. Hepatic and adrenal metastasis 4. Possible bone metastasis to the spine. Electronically Signed   By: Skipper Cliche M.D.   On: 07/22/2015 19:27   Ct Abdomen Pelvis W Contrast  07/23/2015  CLINICAL DATA:  Abdominal pain and constipation, known history of right lung mass EXAM: CT ABDOMEN AND  PELVIS WITH CONTRAST TECHNIQUE: Multidetector CT imaging of the abdomen and pelvis was performed using the standard protocol following bolus administration of intravenous contrast. CONTRAST:  1109m OMNIPAQUE IOHEXOL 300 MG/ML  SOLN COMPARISON:  None. FINDINGS: Lung bases are free of acute infiltrate or sizable effusion. There are multiple hypodense lesions identified throughout the liver. The largest of these lies in the left lobe of the liver best seen on image number 13 of series 2 measuring approximately 30 mm in dimension. These are consistent with metastatic disease. The gallbladder is well distended. No biliary ductal dilatation is seen. Hypodensity is noted in the region of the porta hepatis consistent with lymphadenopathy. This is best seen on image number 20 of series 2. The kidneys demonstrate cystic change on the right. The right adrenal gland is within normal limits. The left adrenal gland is enlarged with a 2.7 cm hypodense mass lesion consistent with metastatic disease. There are 2 hypodense areas within the head and uncinate process of the pancreas. They appear to be separate but may represent one lesion infiltrating in the central portion of the pancreas. This may represent metastatic disease although the possibility of a primary neoplasm within the pancreas would deserve consideration as well. The appendix is well visualized and air filled. No acute appendicitis is identified. The infrarenal aorta shows no aneurysmal dilatation. A retro aortic left renal vein is seen. The bladder is well distended. The prostate is enlarged with central calcification. No significant pelvic lymphadenopathy is seen. No definitive bony abnormality is seen IMPRESSION: Multiple lesions within the liver consistent with metastatic disease. Additionally some porta hepatis lymphadenopathy is noted. There are changes in the region of the head and uncinate process of the pancreas consistent with a focal mass lesion. It is  uncertain whether this represents 2 separate lesions or a larger lesion. It appears intimately associated with the porta hepatis lymphadenopathy. This may represent a primary pancreatic neoplasm although the possibility of metastatic disease deserves consideration given the findings in the chest. 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 Electronically Signed   By: MInez CatalinaM.D.   On: 07/23/2015 15:21    CBC  Recent Labs Lab 07/22/15 1323  WBC 6.8  HGB 13.2  HCT 41.2  PLT 358  MCV 85.5  MCH 27.4  MCHC 32.0  RDW 13.8    Chemistries   Recent Labs Lab 07/22/15 1323 07/22/15 2200  NA 136  --   K 4.8  --   CL 97*  --   CO2 29  --   GLUCOSE 108*  --  BUN 12  --   CREATININE 0.90  --   CALCIUM 10.0  --   AST  --  35  ALT  --  15*  ALKPHOS  --  71  BILITOT  --  0.5   ------------------------------------------------------------------------------------------------------------------ estimated creatinine clearance is 49.5 mL/min (by C-G formula based on Cr of 0.9). ------------------------------------------------------------------------------------------------------------------ No results for input(s): HGBA1C in the last 72 hours. ------------------------------------------------------------------------------------------------------------------ No results for input(s): CHOL, HDL, LDLCALC, TRIG, CHOLHDL, LDLDIRECT in the last 72 hours. ------------------------------------------------------------------------------------------------------------------ No results for input(s): TSH, T4TOTAL, T3FREE, THYROIDAB in the last 72 hours.  Invalid input(s): FREET3 ------------------------------------------------------------------------------------------------------------------ No results for input(s): VITAMINB12, FOLATE, FERRITIN, TIBC, IRON, RETICCTPCT in the last 72 hours.  Coagulation profile  Recent Labs Lab 07/22/15 2200  INR 1.01    No results for input(s): DDIMER in the  last 72 hours.  Cardiac Enzymes No results for input(s): CKMB, TROPONINI, MYOGLOBIN in the last 168 hours.  Invalid input(s): CK ------------------------------------------------------------------------------------------------------------------ Invalid input(s): POCBNP  No results for input(s): GLUCAP in the last 72 hours.   RAI,RIPUDEEP M.D. [facility] 07/25/2015, 10:34 AM  Pager: [phone] Between 7am to 7pm - call Pager - [phone]  After 7pm go to [email or web address] - password [email or web address]  Call night coverage person covering after 7pm

## 2015-07-25 NOTE — Progress Notes (Signed)
Patient's vitals signs for the last three hiurs were stables.Alert and oriented x4,no signs of discomfort nor internal bleeding.Discharge papers were printed,explained and given to patient's daughter(Annette)Explained discharged medications especially pain medicines.Questions were all answered to the patient's daughter 's satisfaction at the time of discharged.

## 2015-07-25 NOTE — Discharge Summary (Addendum)
Physician Discharge Summary   Patient ID: Tyler Potter MRN: [id number] DOB/AGE: [birth year] 74 y.o.  Admit date: 07/22/2015 Discharge date: 07/25/2015  Primary Care Physician:  No primary care provider on file.  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  TESTS THAT NEED FOLLOW-UP CBC, BMET   DIET: regular diet    Allergies:  No Known Allergies   Discharge Medications:   Medication List    STOP taking these medications        aspirin 325 MG EC tablet      TAKE these medications        benzonatate 100 MG capsule  Commonly known as:  TESSALON  Take 1 capsule (100 mg total) by mouth 3 (three) times daily as needed for cough.     guaifenesin 100 MG/5ML syrup  Commonly known as:  ROBITUSSIN  Take 200 mg by mouth 3 (three) times daily as needed for cough.     guaiFENesin-dextromethorphan 100-10 MG/5ML syrup  Commonly known as:  ROBITUSSIN DM  Take 5 mLs by mouth every 4 (four) hours as needed for cough.     HYDROcodone-acetaminophen 5-325 MG tablet  Commonly known as:  NORCO/VICODIN  Take 1-2 tablets by mouth every 6 (six) hours as needed for moderate pain or severe pain.     polyethylene glycol packet  Commonly known as:  MIRALAX  Take 17 g by mouth daily as needed for mild constipation or moderate constipation.     senna-docusate 8.6-50 MG tablet  Commonly known as:  SENOKOT S  Take 2 tablets by mouth at bedtime. For constipation         Brief H and P: For complete details please refer to admission H and P,  HPI on 07/22/2015 by Dr. Roney Jaffe Tyler Potter is a 74 y.o. male with no PMH, has not seen a doctor in years, came to ED  with chest pain, SOB and cough for the past month or so, also weight loss. Labs normal, no home meds, does not see doctors. Daughter said she has been trying to "get him to come in" for some time now. CXR in ED showing R upper lobe collapse, suspected underlying lung mass. And +COPD. Heart normal. Chest CT shows> a right perihilar mass which extends through the mediastinum into the sub carinal and aortopulmonary window regions. Postobstructive volume loss right upper lobe. Findings consistent with malignancy. Also evidence of pulmonary parenchymal metastasis, hepatic and adrenal metastasis and possible bone metastasis to the spine. Patient's main complaint is chest pain and SOB with cough of whitish sputum in abundance. No fevers.   Hospital Course:  Dyspnea secondary to lung mass - CT chest with contrast showed Right perihilar mass which extends through the mediastinum into the subcarinal and aortopulmonary windows, postobstructive volume loss in the right upper lung, hepatic and adrenal metastasis, pulmonary parenchymal metastasis, bone metastasis to the spine -CT abdomen and pelvis showed multiple lesions within the liver consistent with metastatic disease, porta hepatis lymphadenopathy, focal mass lesion in the head and uncinate process of pancreas could represent primary pancreatic neoplasm although the possibility of metastatic disease. -Oncology, Dr. Alen Blew was consulted and recommended biopsy and further work up outpatient -IR was consulted for possible biopsy. -Patient  has no white count, no leukocytosis unlikely shortness of breath related to pneumonia or infection - Patient underwent liver biopsy on 10/27, per oncology okay to be discharged after biopsy. - follow-up appointments with Dr. Alen Blew scheduled on 11/2 at 31 AM  COPD -Currently compensated, no wheezing  Tobacco abuse -Patient states he stopped smoking approximately one year ago.  Malnutrition -Likely secondary to the  above, nutrition consult     Day of Discharge BP 111/68 mmHg  Pulse 66  Temp(Src) 97.9 F (36.6 C) (Oral)  Resp 17  Ht '5\' 9"'$  (1.753 m)  Wt 50.077 kg (110 lb 6.4 oz)  BMI 16.30 kg/m2  SpO2 96%  Physical Exam: General: Alert and awake oriented x3 not in any acute distress. HEENT: anicteric sclera, pupils reactive to light and accommodation CVS: S1-S2 clear no murmur rubs or gallops Chest: decreased BS at bases Abdomen: soft nontender, nondistended, normal bowel sounds Extremities: no cyanosis, clubbing or edema noted bilaterally Neuro: Cranial nerves II-XII intact, no focal neurological deficits   The results of significant diagnostics from this hospitalization (including imaging, microbiology, ancillary and laboratory) are listed below for reference.    LAB RESULTS: Basic Metabolic Panel:  Recent Labs Lab 07/22/15 1323  NA 136  K 4.8  CL 97*  CO2 29  GLUCOSE 108*  BUN 12  CREATININE 0.90  CALCIUM 10.0   Liver Function Tests:  Recent Labs Lab 07/22/15 2200  AST 35  ALT 15*  ALKPHOS 71  BILITOT 0.5  PROT 7.1  ALBUMIN 3.5   No results for input(s): LIPASE, AMYLASE in the last 168 hours. No results for input(s): AMMONIA in the last 168 hours. CBC:  Recent Labs Lab 07/22/15 1323  WBC 6.8  HGB 13.2  HCT 41.2  MCV 85.5  PLT 358   Cardiac Enzymes: No results for input(s): CKTOTAL, CKMB, CKMBINDEX, TROPONINI in the last 168 hours. BNP: Invalid input(s): POCBNP CBG: No results for input(s): GLUCAP in the last 168 hours.  Significant Diagnostic Studies:  Dg Chest 2 View  07/22/2015  CLINICAL DATA:  Productive cough for the past 2 weeks. Mid chest pain. Ex-smoker. EXAM: CHEST  2 VIEW COMPARISON:  None. FINDINGS: Right upper lobe collapse. Bilateral nipple shadows. Mild hyperexpansion of the lungs with mild central peribronchial thickening. Normal sized heart. Thoracolumbar spine degenerative changes. IMPRESSION: 1. Right upper lobe collapse. This is  concerning for a centrally obstructing mass or mucus plug. A chest CT with contrast may provide useful additional information. 2. Mild changes of COPD and chronic bronchitis. Electronically Signed   By: Claudie Revering M.D.   On: 07/22/2015 13:54   Ct Chest W Contrast  07/22/2015  CLINICAL DATA:  Productive cough for 1 month, shortness of breath, abnormal chest radiograph, smoking history EXAM: CT CHEST WITH CONTRAST TECHNIQUE: Multidetector CT imaging of the chest was performed during intravenous contrast administration. CONTRAST:  66m OMNIPAQUE IOHEXOL 300 MG/ML  SOLN COMPARISON:  Chest radiograph 07/22/2015 showing right upper lobe opacity FINDINGS: There is mild centrilobular emphysematous change. There is no evidence of endobronchial mass or mucous plug. The right upper lobe bronchus appear extrinsically compressed by a large soft tissue mass. There is a sub carinal heterogeneous enhancing mass which extends into the right hilum and perihilar right upper lobe. There is associated collapse of the right upper lobe in the right suprahilar region, mass measures about 37 x 50 mm. In the sub carinal region, the mass measures about 23 x 28 mm. Mass and into the anterior mediastinum  and into the aortopulmonary window, worse soft tissue mass component measures 11 x 22 mm. There is a microlobulated mass image number 42 in the right lower lobe, measuring 15 x 9 mm. There is a subpleural nodule image number 51 right lower lobe measuring 4 mm. There is an area of irregular ground-glass attenuation laterally in the right lower lobe measuring about 1 cm, and in the inferior right middle lobe on image number 37 there is a 5 mm nodule. There is mild volume loss in the inferior lingula. There is a sub solid pulmonary nodule laterally in the left lung base on image number 56 measuring 7 mm. Thyroid is normal. Thoracic aorta is normal. No abnormalities involving pulmonary arteries. Minimal pericardial effusion. No pleural  effusion. Images through the upper abdomen demonstrate innumerable liver masses with peripheral enhancement, the largest in the left lobe measuring 3 cm. There is a left adrenal gland mass measuring about 3 cm. There is mild fullness of the right adrenal gland without discrete mass. There is sclerosis of numerous lower cervical and upper thoracic vertebra possibly representing metastasis. IMPRESSION: 1. Right perihilar mass which extends through the mediastinum into the sub carinal and aortopulmonary window regions. Postobstructive volume loss right upper lobe. Findings consistent with malignancy. 2. Evidence of pulmonary parenchymal metastasis 3. Hepatic and adrenal metastasis 4. Possible bone metastasis to the spine. Electronically Signed   By: Skipper Cliche M.D.   On: 07/22/2015 19:27   Ct Abdomen Pelvis W Contrast  07/23/2015  CLINICAL DATA:  Abdominal pain and constipation, known history of right lung mass EXAM: CT ABDOMEN AND PELVIS WITH CONTRAST TECHNIQUE: Multidetector CT imaging of the abdomen and pelvis was performed using the standard protocol following bolus administration of intravenous contrast. CONTRAST:  198m OMNIPAQUE IOHEXOL 300 MG/ML  SOLN COMPARISON:  None. FINDINGS: Lung bases are free of acute infiltrate or sizable effusion. There are multiple hypodense lesions identified throughout the liver. The largest of these lies in the left lobe of the liver best seen on image number 13 of series 2 measuring approximately 30 mm in dimension. These are consistent with metastatic disease. The gallbladder is well distended. No biliary ductal dilatation is seen. Hypodensity is noted in the region of the porta hepatis consistent with lymphadenopathy. This is best seen on image number 20 of series 2. The kidneys demonstrate cystic change on the right. The right adrenal gland is within normal limits. The left adrenal gland is enlarged with a 2.7 cm hypodense mass lesion consistent with metastatic  disease. There are 2 hypodense areas within the head and uncinate process of the pancreas. They appear to be separate but may represent one lesion infiltrating in the central portion of the pancreas. This may represent metastatic disease although the possibility of a primary neoplasm within the pancreas would deserve consideration as well. The appendix is well visualized and air filled. No acute appendicitis is identified. The infrarenal aorta shows no aneurysmal dilatation. A retro aortic left renal vein is seen. The bladder is well distended. The prostate is enlarged with central calcification. No significant pelvic lymphadenopathy is seen. No definitive bony abnormality is seen IMPRESSION: Multiple lesions within the liver consistent with metastatic disease. Additionally some porta hepatis lymphadenopathy is noted. There are changes in the region of the head and uncinate process of the pancreas consistent with a focal mass lesion. It is uncertain whether this represents 2 separate lesions or a larger lesion. It appears intimately associated with the porta hepatis lymphadenopathy. This may represent  a primary pancreatic neoplasm although the possibility of metastatic disease deserves consideration given the findings in the chest. 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 Electronically Signed   By: Inez Catalina M.D.   On: 07/23/2015 15:21    2D ECHO:   Disposition and Follow-up:    DISPOSITION: home    DISCHARGE FOLLOW-UP     Follow-up Information    Follow up with [facility], MD On 07/31/2015.   Specialty:  Oncology   Why:  at 11:00 AM, for hospital follow-up.    Contact information:   [address] [place] [id number] [phone]        Time spent on Discharge: 35 mins   Signed:   RAI,RIPUDEEP M.D. [facility] 07/25/2015, 2:09 PM Pager: [phone]   Addendum First Coding Query  IMPRESSION: Multiple lesions within the liver consistent with  metastatic disease. Additionally some porta hepatis lymphadenopathy is noted.  There are changes in the region of the head and uncinate process of the pancreas consistent with a focal mass lesion. It is uncertain whether this represents 2 separate lesions or a larger lesion. It appears intimately associated with the porta hepatis lymphadenopathy. This may represent a primary pancreatic neoplasm although the possibility of metastatic disease deserves consideration given the findings in the chest.  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  The patient had metastatic lung cancer, per CT abdomen multiple lesions in the liver, left adrenal gland, possible pancreas   Second coding query  The genetic testing were most likely ordered by the oncologist, Dr Alen Blew. Please refer to the genetic testing query to oncology.    RAI,RIPUDEEP M.D. [facility] 08/19/2015, 7:20 AM  Pager: [phone]

## 2015-07-31 ENCOUNTER — Ambulatory Visit (HOSPITAL_BASED_OUTPATIENT_CLINIC_OR_DEPARTMENT_OTHER): Payer: Medicare Other | Admitting: Oncology

## 2015-07-31 VITALS — BP 130/80 | HR 71 | Temp 97.4°F | Resp 16 | Ht 69.0 in | Wt 108.8 lb

## 2015-07-31 DIAGNOSIS — C787 Secondary malignant neoplasm of liver and intrahepatic bile duct: Secondary | ICD-10-CM | POA: Diagnosis not present

## 2015-07-31 DIAGNOSIS — C3411 Malignant neoplasm of upper lobe, right bronchus or lung: Secondary | ICD-10-CM | POA: Diagnosis not present

## 2015-07-31 DIAGNOSIS — K5900 Constipation, unspecified: Secondary | ICD-10-CM | POA: Diagnosis not present

## 2015-07-31 DIAGNOSIS — C3400 Malignant neoplasm of unspecified main bronchus: Secondary | ICD-10-CM

## 2015-07-31 MED ORDER — HYDROCODONE-ACETAMINOPHEN 5-325 MG PO TABS: 1.0000 | ORAL_TABLET | Freq: Four times a day (QID) | ORAL | Status: AC | PRN

## 2015-07-31 MED ORDER — FENTANYL 25 MCG/HR TD PT72: 25.0000 ug | MEDICATED_PATCH | TRANSDERMAL | Status: AC

## 2015-07-31 NOTE — Progress Notes (Signed)
Hematology and Oncology Follow Up Visit  Tyler Potter [id number] Mar 31, 1941 74 y.o. 07/31/2015 11:37 AM No primary care provider on file.No ref. 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 He presented with right hilar mass extending through the mediastinum and liver metastasis.   Prior Therapy: He is status post liver biopsy done on 07/25/2015 which confirmed the presence of adenocarcinoma.  Current therapy: Under consideration for therapy.  Interim History: Tyler Potter presents today for a follow-up visit with his daughter. He is a pleasant gentleman with a heavy tobacco use and have not had routine medical care until he presented with shortness of breath and substantial weight loss. He was hospitalized on 07/23/2015 and underwent a biopsy to confirm the presence of adenocarcinoma likely of a lung primary. Since his discharge, he has been doing very poorly. His performance status is very poor and has not been able to do much. His appetite is very poor and continues to lose weight he reports diffuse pain in the back in the abdomen and takes hydrocodone with very little relief. He still report some cough and dyspnea on exertion. He is spending the majority of his day in bed.  He does not report any headaches, blurry vision, syncope or seizures. Does not report any fevers or chills or sweats. He does report cough but no hemoptysis. He does report dyspnea on exertion. Does not report any nausea, vomiting but does report abdominal pain and constipation. He does not report any frequency urgency or hesitancy. Remaining review of systems unremarkable.  Medications: I have reviewed the patient's current medications.  Current Outpatient Prescriptions  Medication Sig Dispense Refill  . benzonatate (TESSALON) 100 MG capsule Take 1 capsule (100 mg total) by mouth 3 (three) times daily as needed for cough. 60  capsule 0  . guaiFENesin-dextromethorphan (ROBITUSSIN DM) 100-10 MG/5ML syrup Take 5 mLs by mouth every 4 (four) hours as needed for cough. 118 mL 0  . HYDROcodone-acetaminophen (NORCO/VICODIN) 5-325 MG tablet Take 1-2 tablets by mouth every 6 (six) hours as needed for moderate pain or severe pain. 30 tablet 0  . polyethylene glycol (MIRALAX) packet Take 17 g by mouth daily as needed for mild constipation or moderate constipation. 30 each 3  . senna-docusate (SENOKOT S) 8.6-50 MG tablet Take 2 tablets by mouth at bedtime. For constipation 60 tablet 3  . fentaNYL (DURAGESIC - DOSED MCG/HR) 25 MCG/HR patch Place 1 patch (25 mcg total) onto the skin every 3 (three) days. 10 patch 0   No current facility-administered medications for this visit.     Allergies: No Known Allergies  Past Medical History, Surgical history, Social history, and Family History were reviewed and updated.   Blood pressure 130/80, pulse 71, temperature 97.4 F (36.3 C), temperature source Oral, resp. rate 16, height 5' 9"  (1.753 m), weight 108 lb 12.8 oz (49.351 kg), SpO2 100 %. ECOG: 3 General appearance: cachectic, fatigued and mild distress Head: Normocephalic, without obvious abnormality Neck: no adenopathy Lymph nodes: Cervical, supraclavicular, and axillary nodes normal. Heart:regular rate and rhythm, S1, S2 normal, no murmur, click, rub or gallop Lung: Scattered wheezes and rhonchi bilaterally. Abdomin: soft, non-tender, without masses or organomegaly EXT:no erythema, induration, or nodules   Lab Results: Lab Results  Component Value Date   WBC 6.8 07/22/2015   HGB 13.2 07/22/2015   HCT 41.2 07/22/2015   MCV 85.5 07/22/2015   PLT 358 07/22/2015  Chemistry      Component Value Date/Time   NA 136 07/22/2015 1323   K 4.8 07/22/2015 1323   CL 97* 07/22/2015 1323   CO2 29 07/22/2015 1323   BUN 12 07/22/2015 1323   CREATININE 0.90 07/22/2015 1323      Component Value Date/Time   CALCIUM 10.0  07/22/2015 1323   ALKPHOS 71 07/22/2015 2200   AST 35 07/22/2015 2200   ALT 15* 07/22/2015 2200   BILITOT 0.5 07/22/2015 2200       Radiological Studies:   Impression and Plan:   74 year old gentleman with the following issues:  1. Stage IV adenocarcinoma of the lung presented with a large hilar mass measuring 3.7 x 5.0 cm associated with abdominal adenopathy as well as hepatic metastasis. He underwent a biopsy on 07/25/2015 which showed adenocarcinoma. The pathology results was discussed with the reviewing pathologist who favored either GI or lung primary. The clinical presentation is highly suggestive of a lung cancer with metastatic disease to the liver and possibly the pancreas. Further molecular testing such as EGFR is currently pending.  The natural course of this disease was discussed with the patient and his daughter. He understand he has an incurable malignancy and his prognosis is very poor. Given the debilitated state that he is in, poor respiratory status, poor performance status I think his overall life expectancy is very limited.  Options of treatment would include palliative systemic therapy versus supportive care only. Risks and benefits of all these approaches were debated. The use of systemic IV chemotherapy would be ill which was to palliate his therapy if he has EGFR wild type tumor. I think she is a poor candidate for relapsed and was tolerated poorly. He would be a reasonable candidate to try Tarceva if he does indeed has EGFR mutation. After discussion today, he declined all therapy and elected to proceed with hospice.  I think hospice enrollment is very reasonable for him given his poor performance status, limited life expectancy and the incurable nature of his cancer.  2. Pain: This was addressed today and he will need long-acting pain medication. I prescribed him fentanyl 25 g an hour patch to use and encouraged him to use the hydrocodone as needed for  breakthrough.  3. Constipation: I urged him to use the stool softeners and laxatives to ensure regular bowel habits.  4. Advanced directives: As discussed today I advised him to have no CODE BLUE status in case of a hospitalization. He agrees that he does not want any artificial life sustaining measures such as CPR, intubation etc.  5. Follow-up: Will be as needed. He will be under hospice care.   VCBSWH,QPRFF, MD 11/2/201611:37 AM

## 2015-07-31 NOTE — Progress Notes (Signed)
Per Dr. Alen Blew, I made a referral to [facility].

## 2015-08-23 ENCOUNTER — Encounter (HOSPITAL_COMMUNITY): Payer: Self-pay

## 2015-08-26 ENCOUNTER — Encounter: Payer: Self-pay | Admitting: *Deleted

## 2015-08-26 NOTE — Progress Notes (Signed)
Received faxed notice from [facility], that [name], a Hospice Nurse, called to inform [facility] that Mr. Tyler Potter passed away on 08/07/2015 at 1:54 pm. Dr. Marin Olp, physician on call, was notified of the patient's death. POF sent to scheduling.

## 2015-08-29 DEATH — deceased

## 2016-05-13 IMAGING — CT CT CHEST W/ CM
2 of 3 series · 15 of 36 positions shown, 18 images · IV contrast (Omni 300)
Comparison: Chest radiograph 07/22/2015 showing right upper lobe
opacity

CLINICAL DATA: Productive cough for 1 month, shortness of breath,
abnormal chest radiograph, smoking history

EXAM:
CT CHEST WITH CONTRAST
TECHNIQUE: Multidetector CT imaging of the chest was performed during
intravenous contrast administration.
CONTRAST:  75mL OMNIPAQUE IOHEXOL 300 MG/ML  SOLN

[Series 2: chest with 5mm st · axial · 0.62mm/px · z∈[-379,-64]mm · 12 of 75 slices shown, 15 images]
[im 6/75  mediastinal]
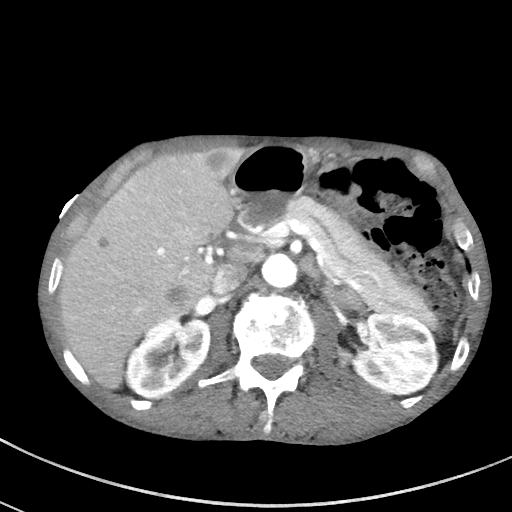
[im 6/75  lung]
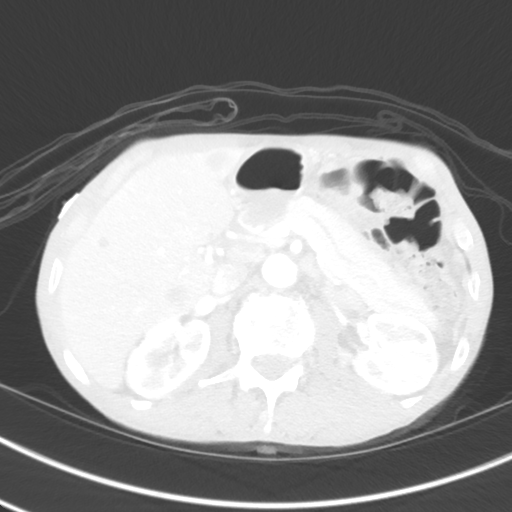
[im 11/75  lung]
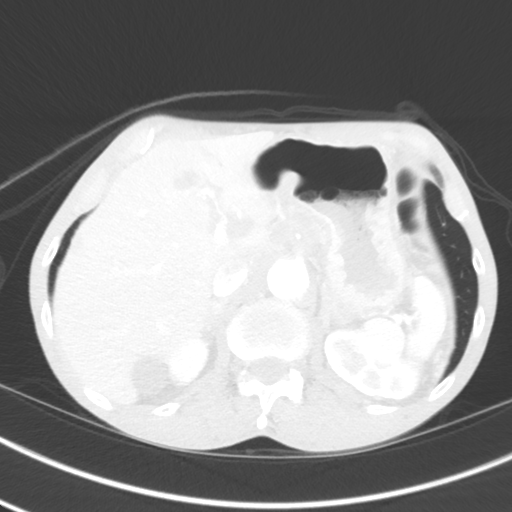
[im 17/75  lung]
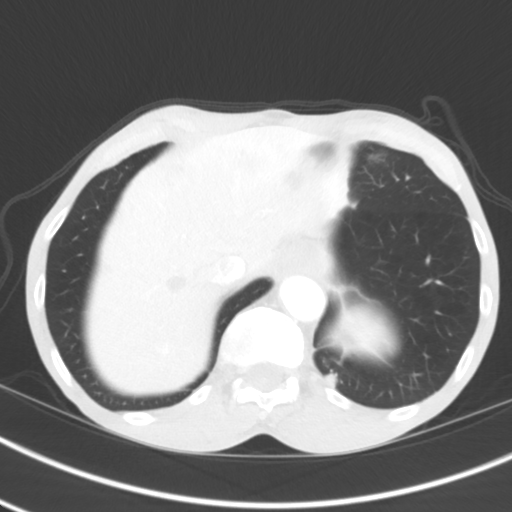
[im 22/75  lung]
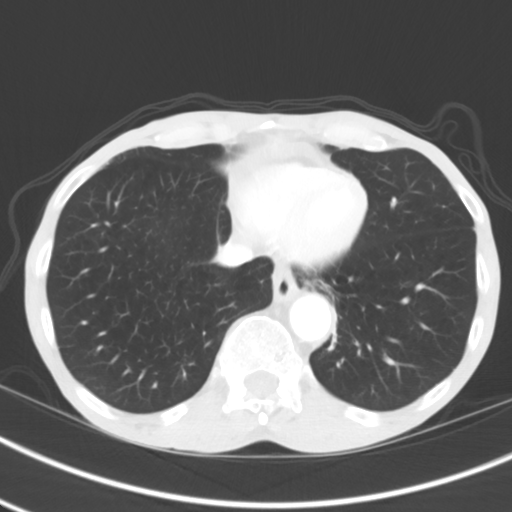
[im 28/75  mediastinal]
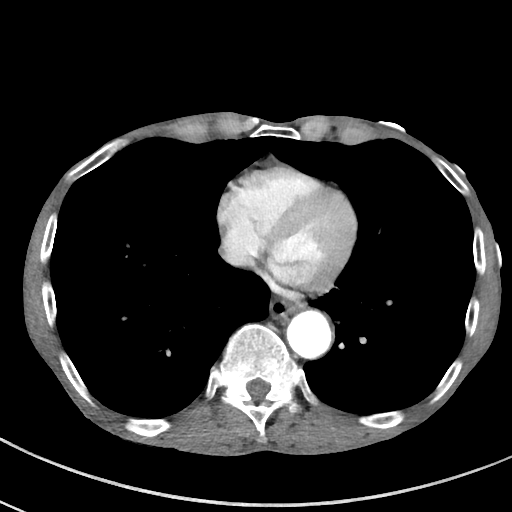
[im 28/75  lung]
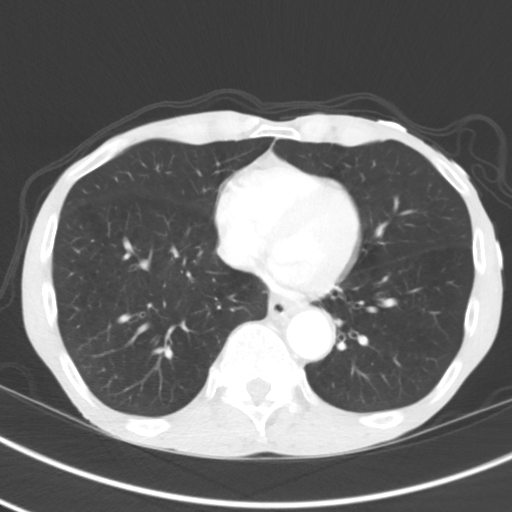
[im 33/75  lung]
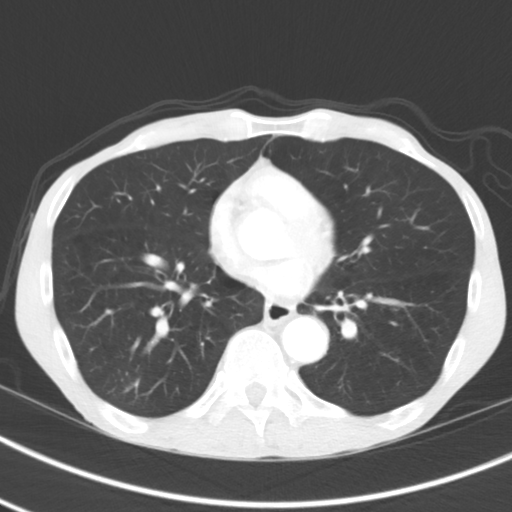
[im 42/75  lung]
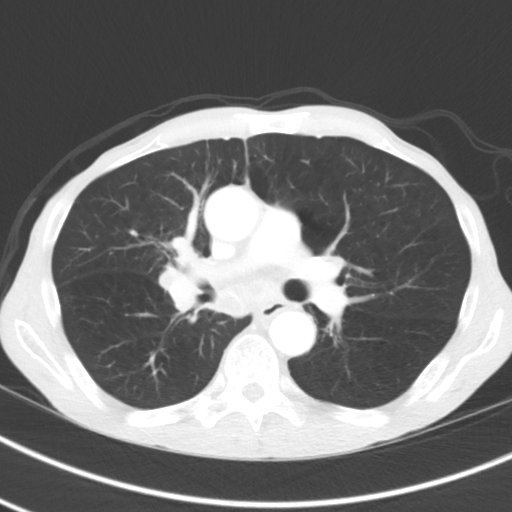
[im 47/75  lung]
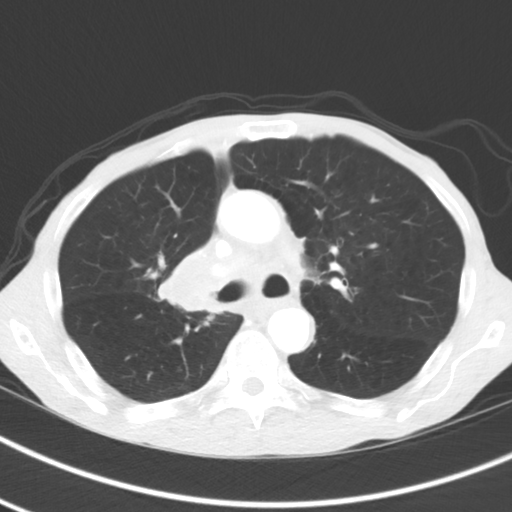
[im 53/75  mediastinal]
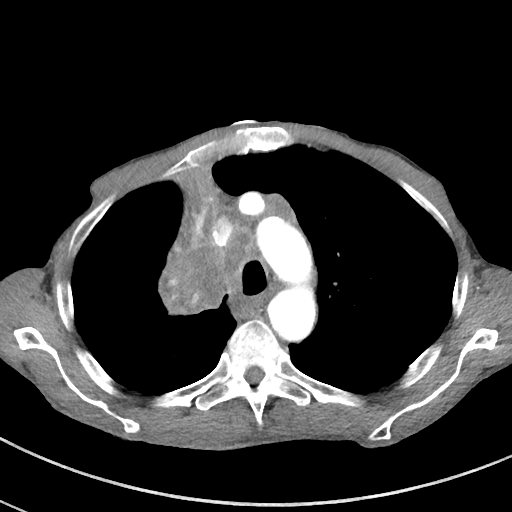
[im 53/75  lung]
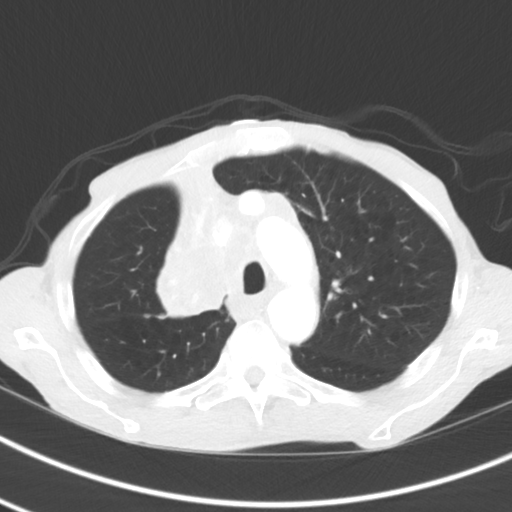
[im 58/75  lung]
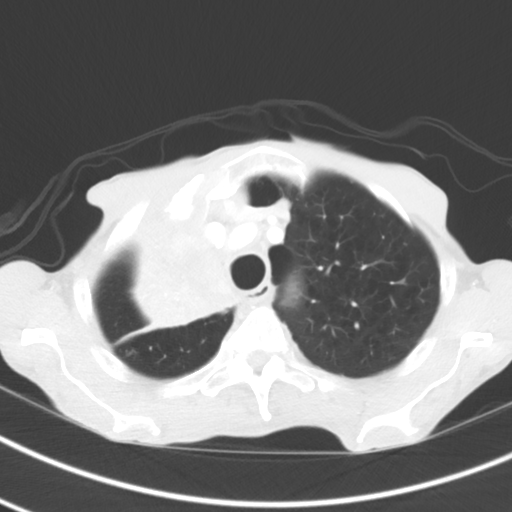
[im 64/75  lung]
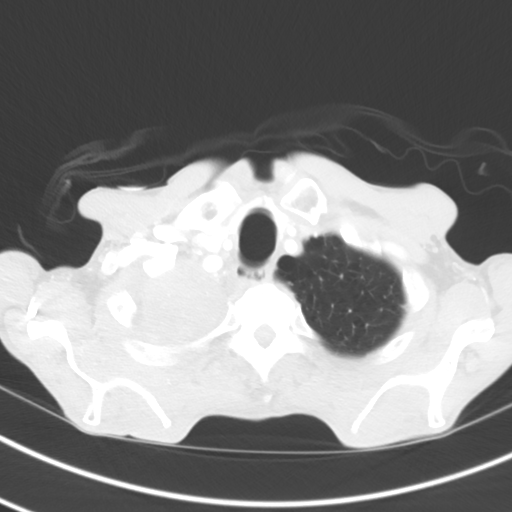
[im 69/75  lung]
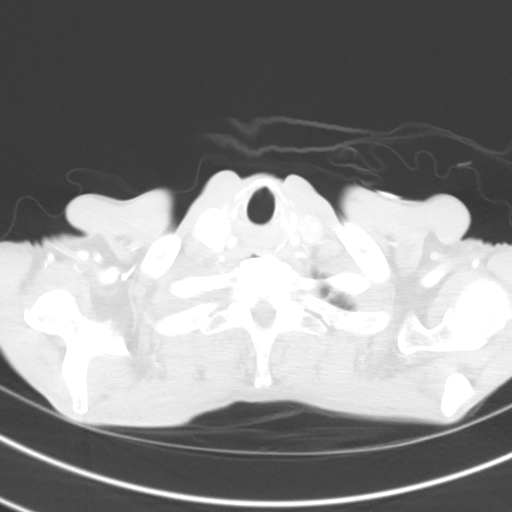

[Series 5: chest with 3mm st cor · coronal · 0.71mm/px · 3 of 75 slices shown]
[im 15/75  lung]
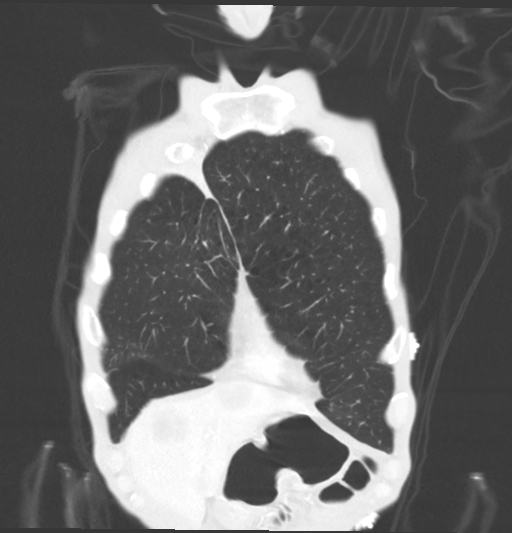
[im 30/75  lung]
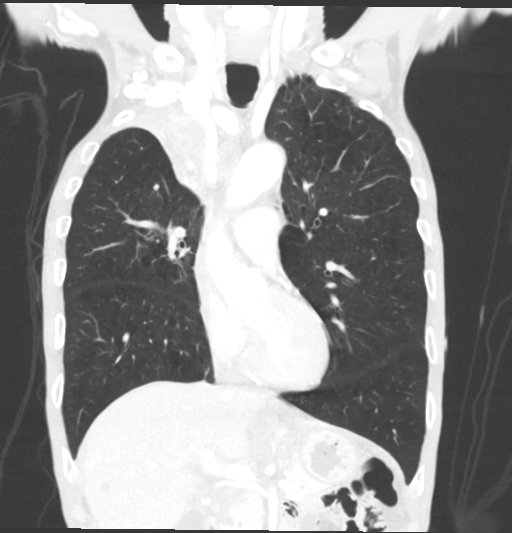
[im 45/75  lung]
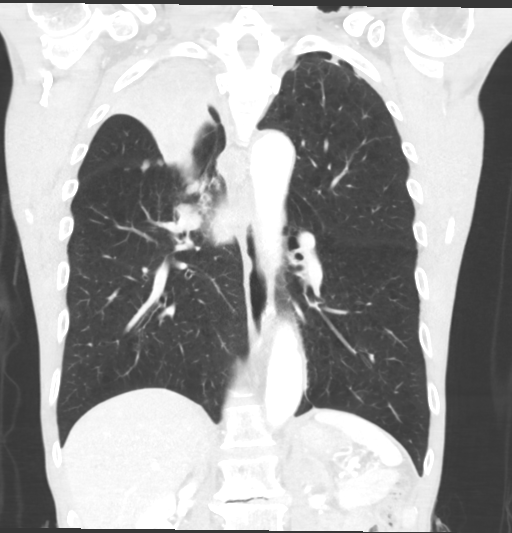

[15 of 36 positions shown; findings below may reference images not displayed]

FINDINGS: There is mild centrilobular emphysematous change. There is no
evidence of endobronchial mass or mucous plug. The right upper lobe
bronchus appear extrinsically compressed by a large soft tissue
mass. There is a sub carinal heterogeneous enhancing mass which
extends into the right hilum and perihilar right upper lobe. There
is associated collapse of the right upper lobe in the right
suprahilar region, mass measures about 37 x 50 mm. In the sub
carinal region, the mass measures about 23 x 28 mm. Mass and into
the anterior mediastinum and into the aortopulmonary window, worse
soft tissue mass component measures 11 x 22 mm.

There is a microlobulated mass image number 42 in the right lower
lobe, measuring 15 x 9 mm. There is a subpleural nodule image number
51 right lower lobe measuring 4 mm. There is an area of irregular
ground-glass attenuation laterally in the right lower lobe measuring
about 1 cm, and in the inferior right middle lobe on image number 37
there is a 5 mm nodule.

There is mild volume loss in the inferior lingula. There is a sub
solid pulmonary nodule laterally in the left lung base on image
number 56 measuring 7 mm.

Thyroid is normal. Thoracic aorta is normal. No abnormalities
involving pulmonary arteries. Minimal pericardial effusion. No
pleural effusion.

Images through the upper abdomen demonstrate innumerable liver
masses with peripheral enhancement, the largest in the left lobe
measuring 3 cm. There is a left adrenal gland mass measuring about 3
cm. There is mild fullness of the right adrenal gland without
discrete mass.

There is sclerosis of numerous lower cervical and upper thoracic
vertebra possibly representing metastasis.
IMPRESSION: 1. Right perihilar mass which extends through the mediastinum into
the sub carinal and aortopulmonary window regions. Postobstructive
volume loss right upper lobe. Findings consistent with malignancy.
2. Evidence of pulmonary parenchymal metastasis
3. Hepatic and adrenal metastasis
4. Possible bone metastasis to the spine.
